# Patient Record
Sex: Female | Born: 2000 | Race: White | Hispanic: No | Marital: Single | Smoking: Never smoker
Health system: Southern US, Community
[De-identification: ages and names within clinical notes are randomized; demographics above are authoritative.]

## PROBLEM LIST (undated history)

## (undated) ENCOUNTER — Emergency Department (HOSPITAL_BASED_OUTPATIENT_CLINIC_OR_DEPARTMENT_OTHER): Discharge status: Against Medical Advice | Payer: BC Managed Care – PPO

## (undated) DIAGNOSIS — E785 Hyperlipidemia, unspecified: Secondary | ICD-10-CM

## (undated) DIAGNOSIS — I10 Essential (primary) hypertension: Secondary | ICD-10-CM

## (undated) HISTORY — DX: Hyperlipidemia, unspecified: E78.5

---
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
## 2019-02-05 ENCOUNTER — Other Ambulatory Visit: Payer: Self-pay

## 2019-02-05 DIAGNOSIS — Z20822 Contact with and (suspected) exposure to covid-19: Secondary | ICD-10-CM

## 2019-02-06 LAB — NOVEL CORONAVIRUS, NAA: SARS-CoV-2, NAA: NOT DETECTED

## 2019-04-02 ENCOUNTER — Telehealth: Payer: Self-pay | Admitting: Family Medicine

## 2019-04-02 NOTE — Telephone Encounter (Signed)
Please see previous my chart message. Patient contacted office via mychart stating she almost passed out and had difficulty breathing. I asked Dr. Pricilla Handler for advise and she advise that due to possible emergent issue she suggest patient be seen at either urgent care or ER. I sent the message back to the patient via mychart but also called her and verbally advised patient of this recommendation. Patient verbalized understanding and stated she would be going to get treatment today. AS, CMA

## 2019-07-20 ENCOUNTER — Telehealth: Payer: Self-pay | Admitting: Family Medicine

## 2019-07-20 NOTE — Telephone Encounter (Signed)
Patient a Clinical cytogeneticist message requesting appt w/ provider for certain wk days 4/23-4/30--Called her back no answer,left msg for her to call office.---NO appts available for specific dates pt requested.  --glh

## 2019-12-17 ENCOUNTER — Telehealth: Payer: Self-pay | Admitting: Physician Assistant

## 2019-12-17 DIAGNOSIS — N926 Irregular menstruation, unspecified: Secondary | ICD-10-CM

## 2019-12-17 DIAGNOSIS — Z3202 Encounter for pregnancy test, result negative: Secondary | ICD-10-CM

## 2019-12-17 NOTE — Telephone Encounter (Signed)
Patient's mother called into office needing advise.  Molly Dominguez has not had a period in 3 months.  She has taken a pregnancy test, it is negative.  Patient is sexually active and has no other symptoms.  Please Advise patient of next steps.

## 2019-12-17 NOTE — Telephone Encounter (Signed)
Spoke with Kandis Cocking who states since patient home pregnancy test are negative that the abnormal menstrual cycle could be due to weight. We have not seen patient since 2019 and do not have a recent weight.  Per patients mother patient does have a hx of abnormal menstrual cycles-going around 2 months without a period.  Spoke with patient directly since mother is not on DPR and advised that we would refer to OBGYN and they would call patient to schedule. Patient was agreeable and verbalized understanding. AS, CMA

## 2020-01-04 ENCOUNTER — Other Ambulatory Visit: Payer: Self-pay

## 2020-01-04 ENCOUNTER — Encounter: Payer: Self-pay | Admitting: Nurse Practitioner

## 2020-01-04 ENCOUNTER — Ambulatory Visit (INDEPENDENT_AMBULATORY_CARE_PROVIDER_SITE_OTHER): Payer: BC Managed Care – PPO | Admitting: Nurse Practitioner

## 2020-01-04 VITALS — BP 122/80 | Ht 61.0 in | Wt 222.0 lb

## 2020-01-04 DIAGNOSIS — N911 Secondary amenorrhea: Secondary | ICD-10-CM

## 2020-01-04 DIAGNOSIS — Z6841 Body Mass Index (BMI) 40.0 and over, adult: Secondary | ICD-10-CM | POA: Diagnosis not present

## 2020-01-04 LAB — PREGNANCY, URINE: Preg Test, Ur: NEGATIVE

## 2020-01-04 MED ORDER — MEGESTROL ACETATE 40 MG PO TABS: 40.0000 mg | ORAL_TABLET | Freq: Two times a day (BID) | ORAL | 0 refills | Status: AC

## 2020-01-04 NOTE — Progress Notes (Signed)
   Acute Office Visit  Subjective:    Patient ID: Molly Dominguez, female    DOB: [birth year], 19 y.o.   MRN: [id number]   HPI 19 y.o. presents today for amenorrhea.  LMP 09/08/2019.  Prior to this she has always had irregular cycles ranging from 1 to 2 months since menarche at age [age].  Sexually active with one partner.  Has taken 4 at home pregnancy tests, all negative.  Has had a 25 pound weight gain in the last 1 to 2 years.  Had vaginal bump recently that was at introitus on left side.  Says it was large and very painful but it did burst and had clear/bloody liquid.   Review of Systems  Constitutional: Negative.   Endocrine: Negative for cold intolerance and heat intolerance.  Genitourinary: Positive for menstrual problem. Negative for vaginal discharge and vaginal pain.       Objective:    Physical Exam Constitutional:      Appearance: Normal appearance. She is obese.  Genitourinary:    General: Normal vulva.     Vagina: Normal.     Cervix: Normal.     Uterus: Normal.      Comments: No presence of bump    BP 122/80 (BP Location: Right Arm, Patient Position: Sitting, Cuff Size: Large)   Ht 5\' 1"  (1.549 m)   Wt 222 lb (100.7 kg)   LMP 09/08/2019   BMI 41.95 kg/m  Wt Readings from Last 3 Encounters:  01/04/20 222 lb (100.7 kg)  08/07/16 201 lb 3 oz (91.3 kg) (98 %, Z= 2.04)*  02/05/14 163 lb (73.9 kg) (95 %, Z= 1.66)*   * Growth percentiles are based on CDC (Girls, 2-20 Years) data.        Assessment & Plan:   Problem List Items Addressed This Visit    None    Visit Diagnoses    Secondary amenorrhea    -  Primary   Relevant Medications   megestrol (MEGACE) 40 MG tablet   Other Relevant Orders   CBC with Differential/Platelet   Prolactin   TSH   Pregnancy, urine   BMI 40.0-44.9, adult (HCC)          Plan: TSH, prolactin, CBC today.  UPT.  Megace 40 mg twice a day for 10 days for progesterone withdrawal trial.  If bleeding does not occur within 14 days  after stopping progesterone she will let me know.  Discussed possibility of weight gain being cause for change in cycles and that a weight loss of 5 to 10% of current weight may help to regulate cycles.  She is agreeable to plan.    01-13-1982 [facility], 10:00 AM 01/04/2020

## 2020-01-25 ENCOUNTER — Encounter: Payer: Self-pay | Admitting: Nurse Practitioner

## 2020-01-25 ENCOUNTER — Other Ambulatory Visit: Payer: Self-pay

## 2020-01-25 ENCOUNTER — Ambulatory Visit (INDEPENDENT_AMBULATORY_CARE_PROVIDER_SITE_OTHER): Payer: BC Managed Care – PPO | Admitting: Nurse Practitioner

## 2020-01-25 VITALS — BP 120/82

## 2020-01-25 DIAGNOSIS — Z6841 Body Mass Index (BMI) 40.0 and over, adult: Secondary | ICD-10-CM | POA: Diagnosis not present

## 2020-01-25 DIAGNOSIS — N911 Secondary amenorrhea: Secondary | ICD-10-CM | POA: Diagnosis not present

## 2020-01-25 LAB — CBC WITH DIFFERENTIAL/PLATELET
Eosinophils Relative: 1.1 %
MCH: 28.6 pg (ref 27.0–33.0)
Monocytes Relative: 5.4 %
Total Lymphocyte: 20.2 %

## 2020-01-25 NOTE — Progress Notes (Signed)
   Acute Office Visit  Subjective:    Patient ID: Molly Dominguez, female    DOB: [birth year], 19 y.o.   MRN: [id number]   HPI 19 y.o. presents today for follow up of secondary amenorrhea. She was seen by me 01/04/2020 with complaints of no cycle for 4 months. She has always had irregular cycles that range from 1-2 months since menarche at age [age]. She reported a 25 pound weight gain over a 1-2 year span. Recommended labs at that visit but she forgot to stop and get them so she will do them today. She was prescribed Provera for withdrawal bleeding but was unable to tolerate. We discussed a weight loss of 5-10% helping to improve amenorrhea. Since that visit she has been exercising more and she started her cycle 2 days ago.    Review of Systems  Constitutional: Negative.   Gastrointestinal: Negative.   Genitourinary: Positive for menstrual problem.       Objective:    Physical Exam Constitutional:      Appearance: Normal appearance. She is obese.     BP 120/82 (BP Location: Right Arm, Patient Position: Sitting, Cuff Size: Large)   LMP 01/22/2020  Wt Readings from Last 3 Encounters:  01/04/20 222 lb (100.7 kg)  08/07/16 201 lb 3 oz (91.3 kg) (98 %, Z= 2.04)*  02/05/14 163 lb (73.9 kg) (95 %, Z= 1.66)*   * Growth percentiles are based on CDC (Girls, 2-20 Years) data.        Assessment & Plan:   Problem List Items Addressed This Visit    None    Visit Diagnoses    Secondary amenorrhea    -  Primary   BMI 40.0-44.9, adult (HCC)         Plan: CBC, TSH, prolactin today. Reassurance provided on probable weight loss helping to regulate cycles. She plans to continue working on this and continuing exercise. If labs normal and no improvement with weight loss we will schedule an ultrasound. She is agreeable to plan.      Olivia Mackie [facility], 3:35 PM 01/25/2020

## 2020-01-26 LAB — CBC WITH DIFFERENTIAL/PLATELET
Absolute Monocytes: 729 cells/uL (ref 200–950)
Basophils Absolute: 68 cells/uL (ref 0–200)
Basophils Relative: 0.5 %
Eosinophils Absolute: 149 cells/uL (ref 15–500)
HCT: 43.1 % (ref 35.0–45.0)
Hemoglobin: 14.2 g/dL (ref 11.7–15.5)
Lymphs Abs: 2727 cells/uL (ref 850–3900)
MCHC: 32.9 g/dL (ref 32.0–36.0)
MCV: 86.7 fL (ref 80.0–100.0)
MPV: 11.3 fL (ref 7.5–12.5)
Neutro Abs: 9828 cells/uL — ABNORMAL HIGH (ref 1500–7800)
Neutrophils Relative %: 72.8 %
Platelets: 369 10*3/uL (ref 140–400)
RBC: 4.97 10*6/uL (ref 3.80–5.10)
RDW: 12.3 % (ref 11.0–15.0)
WBC: 13.5 10*3/uL — ABNORMAL HIGH (ref 3.8–10.8)

## 2020-01-26 LAB — PROLACTIN: Prolactin: 18.2 ng/mL

## 2020-01-26 LAB — TSH: TSH: 1.43 mIU/L

## 2020-10-04 ENCOUNTER — Ambulatory Visit
Admission: EM | Admit: 2020-10-04 | Discharge: 2020-10-04 | Disposition: A | Discharge status: Home / Self Care | Payer: Self-pay | Attending: Emergency Medicine | Admitting: Emergency Medicine

## 2020-10-04 ENCOUNTER — Other Ambulatory Visit: Payer: Self-pay

## 2020-10-04 DIAGNOSIS — R42 Dizziness and giddiness: Secondary | ICD-10-CM

## 2020-10-04 DIAGNOSIS — R112 Nausea with vomiting, unspecified: Secondary | ICD-10-CM

## 2020-10-04 LAB — POCT URINALYSIS DIP (MANUAL ENTRY)
Bilirubin, UA: NEGATIVE
Blood, UA: NEGATIVE
Glucose, UA: NEGATIVE mg/dL
Ketones, POC UA: NEGATIVE mg/dL
Leukocytes, UA: NEGATIVE
Nitrite, UA: NEGATIVE
Protein Ur, POC: 30 mg/dL — AB
Spec Grav, UA: 1.02 (ref 1.010–1.025)
Urobilinogen, UA: 0.2 E.U./dL
pH, UA: 8 (ref 5.0–8.0)

## 2020-10-04 LAB — POCT FASTING CBG KUC MANUAL ENTRY: POCT Glucose (KUC): 90 mg/dL (ref 70–99)

## 2020-10-04 MED ORDER — MECLIZINE HCL 12.5 MG PO TABS: 25.0000 mg | ORAL_TABLET | Freq: Three times a day (TID) | ORAL | 0 refills | Status: DC | PRN

## 2020-10-04 MED ORDER — HYDROXYZINE HCL 25 MG PO TABS: 25.0000 mg | ORAL_TABLET | Freq: Four times a day (QID) | ORAL | 0 refills | Status: DC | PRN

## 2020-10-04 MED ORDER — ONDANSETRON 4 MG PO TBDP: 4.0000 mg | ORAL_TABLET | Freq: Three times a day (TID) | ORAL | 0 refills | Status: DC | PRN

## 2020-10-04 NOTE — ED Triage Notes (Signed)
Patient presents to Urgent Care with complaints of chest pain, SOB, blurred vision, dizziness, shakiness, nausea, and 3 episodes of vomiting since yesterday. Pt reports she has been exp heart palpitations for about 1-2 months now. Pt states she has no hx of any medical issues last wellness check 2 years ago. She states last BP check today was 142/105.   Denies fever or abdominal pain.

## 2020-10-04 NOTE — ED Provider Notes (Signed)
[name]    CSN: [id number] Arrival date & time: 10/04/20  1556      History   Chief Complaint Chief Complaint  Patient presents with   Shortness of Breath   Chest Pain    HPI Any Mcneice is a 20 y.o. female presenting today for evaluation of chest pain and shortness of breath.  Reports over the past 1 to 2 days has developed chest pain, shortness of breath with associated blurred vision, dizziness, nausea and vomiting.  Reports last night she had had 2 alcoholic drinks, later in the evening around 1030 she smoked marijuana, around 1130 when she went home she felt normal, laid down and then felt a sensation of chest tightness, heart racing/beating hard, dizziness described as spinning as well as associated spots in her vision.  Reports feeling shaky and having difficulty standing.  This led to 3 episodes of nonbloody emesis.  After she continued to feel nauseous and dizzy for couple of hours but ended up falling asleep.  Woke up today and is continued to have some discomfort in her chest mainly centrally as well as some slight vision and dizziness with nausea, but the symptoms are improved/more mild from what she was experiencing last night.  She does report that she has had episodes of palpitations over the past 1 to 2 months and will typically feel a couple times per week.  Reports occasional vaping, denies cigarette use.  Reports she has had occasional marijuana use for many years without similar symptoms.  Reports that dizziness is worse with standing, improved with standing.  Was able to tolerate some oral intake today including chicken and 2 and half bottles of water.  Continues to feel a slight fogginess.  Denies associated headache last night or today.  Denies any numbness tingling or weakness.  Denies leg pain or leg swelling.  Denies history of DVT/PE.  Denies recent travel immobilization or surgery.  Denies any birth control use.  Last menstrual cycle approximately 1  month ago. Denies abdominal pain.    HPI  History reviewed. No pertinent past medical history.  Patient Active Problem List   Diagnosis Date Noted   Healthcare maintenance 01/21/2017   Obesity (BMI 35.0-39.9 without comorbidity) 01/21/2017    History reviewed. No pertinent surgical history.  OB History     Gravida  0   Para  0   Term  0   Preterm  0   AB  0   Living  0      SAB  0   IAB  0   Ectopic  0   Multiple  0   Live Births  0            Home Medications    Prior to Admission medications   Medication Sig Start Date End Date Taking? Authorizing Provider  hydrOXYzine (ATARAX/VISTARIL) 25 MG tablet Take 1 tablet (25 mg total) by mouth every 6 (six) hours as needed for anxiety. 10/04/20  Yes Coleta Grosshans C, PA-C  meclizine (ANTIVERT) 12.5 MG tablet Take 2 tablets (25 mg total) by mouth 3 (three) times daily as needed for dizziness. 10/04/20  Yes Camiyah Friberg C, PA-C  ondansetron (ZOFRAN ODT) 4 MG disintegrating tablet Take 1 tablet (4 mg total) by mouth every 8 (eight) hours as needed for nausea or vomiting. 10/04/20  Yes Quynh Basso, [place] C, PA-C    Family History Family History  Problem Relation Age of Onset   Hyperlipidemia Mother    Hypertension  Mother    Hypertension Father    Hyperlipidemia Father     Social History Social History   Tobacco Use   Smoking status: Never   Smokeless tobacco: Never  Substance Use Topics   Alcohol use: No   Drug use: Yes    Types: Marijuana     Allergies   Patient has no known allergies.   Review of Systems Review of Systems  Constitutional:  Negative for fatigue and fever.  HENT:  Negative for congestion, sinus pressure and sore throat.   Eyes:  Negative for photophobia, pain and visual disturbance.  Respiratory:  Negative for cough and shortness of breath.   Cardiovascular:  Positive for chest pain.  Gastrointestinal:  Positive for nausea and vomiting. Negative for abdominal pain.   Genitourinary:  Negative for decreased urine volume and hematuria.  Musculoskeletal:  Negative for myalgias, neck pain and neck stiffness.  Neurological:  Positive for dizziness and headaches. Negative for syncope, facial asymmetry, speech difficulty, weakness, light-headedness and numbness.    Physical Exam Triage Vital Signs ED Triage Vitals  Enc Vitals Group     BP 10/04/20 1609 (!) 150/91     Pulse Rate 10/04/20 1609 (!) 112     Resp 10/04/20 1609 18     Temp 10/04/20 1609 98.8 F (37.1 C)     Temp Source 10/04/20 1609 Oral     SpO2 10/04/20 1609 98 %     Weight --      Height --      Head Circumference --      Peak Flow --      Pain Score 10/04/20 1606 0     Pain Loc --      Pain Edu? --      Excl. in GC? --    No data found.  Updated Vital Signs BP 137/81 (BP Location: Left Arm)   Pulse 96   Temp 98.8 F (37.1 C) (Oral)   Resp 18   LMP  (Within Months) Comment: 1 month ago  SpO2 97%   Visual Acuity Right Eye Distance:   Left Eye Distance:   Bilateral Distance:    Right Eye Near:   Left Eye Near:    Bilateral Near:     Physical Exam Vitals and nursing note reviewed.  Constitutional:      Appearance: She is well-developed.     Comments: No acute distress  HENT:     Head: Normocephalic and atraumatic.     Ears:     Comments: Bilateral ears without tenderness to palpation of external auricle, tragus and mastoid, EAC's without erythema or swelling, TM's with good bony landmarks and cone of light. Non erythematous.      Nose: Nose normal.     Mouth/Throat:     Comments: Oral mucosa pink and moist, no tonsillar enlargement or exudate. Posterior pharynx patent and nonerythematous, no uvula deviation or swelling. Normal phonation.  Eyes:     Extraocular Movements: Extraocular movements intact.     Conjunctiva/sclera: Conjunctivae normal.     Pupils: Pupils are equal, round, and reactive to light.  Cardiovascular:     Rate and Rhythm: Normal rate and  regular rhythm.  Pulmonary:     Effort: Pulmonary effort is normal. No respiratory distress.     Comments: Breathing comfortably at rest, CTABL, no wheezing, rales or other adventitious sounds auscultated  Abdominal:     General: There is no distension.     Comments: Nontender to palpation  Musculoskeletal:        General: Normal range of motion.     Cervical back: Neck supple.     Comments: Bilateral lower legs symmetric without calf swelling or tenderness  Skin:    General: Skin is warm and dry.  Neurological:     General: No focal deficit present.     Mental Status: She is alert and oriented to person, place, and time. Mental status is at baseline.     Cranial Nerves: No cranial nerve deficit.     Motor: No weakness.     UC Treatments / Results  Labs (all labs ordered are listed, but only abnormal results are displayed) Labs Reviewed  POCT URINALYSIS DIP (MANUAL ENTRY) - Abnormal; Notable for the following components:      Result Value   Clarity, UA cloudy (*)    Protein Ur, POC =30 (*)    All other components within normal limits  BASIC METABOLIC PANEL  CBC WITH DIFFERENTIAL/PLATELET  TSH  POCT FASTING CBG [facility] MANUAL ENTRY    EKG   Radiology No results found.  Procedures Procedures (including critical care time)  Medications Ordered in UC Medications - No data to display  Initial Impression / Assessment and Plan / UC Course  I have reviewed the triage vital signs and the nursing notes.  Pertinent labs & imaging results that were available during my care of the patient were reviewed by me and considered in my medical decision making (see chart for details).  Clinical Course as of 10/04/20 1733  Tue Oct 04, 2020  1624 137/81; HR 96, O2 97% [HW]    Clinical Course User Index [HW] Whittany Parish C, PA-C    Dizziness/nausea/chest tightness-EKG normal sinus rhythm, no acute signs of ischemia or infarction, no abnormal beats, no chest x-ray available in  house today, will provide outpatient follow-up chest x-ray to further evaluate for shortness of breath/chest tightness, checking basic labs of CBC and BMP along with TSH.  Suspect possible correlation to alcohol/marijuana use or underlying anxiety, symptoms are improved today and without any current neurodeficits.  Low suspicion of ACS.  Opting to treat symptomatically and supportively with close monitoring.  Encouraged follow-up in ED if symptoms worsening again, outpatient follow-up with cardiology for further evaluation of palpitations.  Discussed strict return precautions. Patient verbalized understanding and is agreeable with plan.     Final Clinical Impressions(s) / UC Diagnoses   Final diagnoses:  Dizziness  Non-intractable vomiting with nausea, unspecified vomiting type     Discharge Instructions      Please go to emergency room if symptoms are worsening  Blood work pending; May go to [facility] for chest xray Please use Zofran dissolved in mouth as needed for nausea/vomiting May try meclizine as needed for dizziness/nausea, may cause drowsiness Hydroxyzine as needed for anxiety, may cause drowsiness, limit use with meclizine Rest and drink plenty of fluids Follow-up with cardiology for further evaluation of palpitations Please establish care with primary care      ED Prescriptions     Medication Sig Dispense Auth. Provider   ondansetron (ZOFRAN ODT) 4 MG disintegrating tablet Take 1 tablet (4 mg total) by mouth every 8 (eight) hours as needed for nausea or vomiting. 20 tablet Jayleon Mcfarlane C, PA-C   meclizine (ANTIVERT) 12.5 MG tablet Take 2 tablets (25 mg total) by mouth 3 (three) times daily as needed for dizziness. 30 tablet Merridy Pascoe C, PA-C   hydrOXYzine (ATARAX/VISTARIL) 25 MG tablet Take 1  tablet (25 mg total) by mouth every 6 (six) hours as needed for anxiety. 16 tablet Charleen Madera, [place] C, PA-C      PDMP not reviewed this encounter.   Lew Dawes, [place] 10/04/20 1733

## 2020-10-04 NOTE — Discharge Instructions (Addendum)
Please go to emergency room if symptoms are worsening  Blood work pending; May go to [facility] for chest xray Please use Zofran dissolved in mouth as needed for nausea/vomiting May try meclizine as needed for dizziness/nausea, may cause drowsiness Hydroxyzine as needed for anxiety, may cause drowsiness, limit use with meclizine Rest and drink plenty of fluids Follow-up with cardiology for further evaluation of palpitations Please establish care with primary care

## 2020-10-04 NOTE — ED Notes (Signed)
BG=90

## 2020-10-06 ENCOUNTER — Encounter: Payer: Self-pay | Admitting: Physician Assistant

## 2020-10-06 LAB — CBC WITH DIFFERENTIAL/PLATELET
Basophils Absolute: 0.1 10*3/uL (ref 0.0–0.2)
Basos: 0 %
EOS (ABSOLUTE): 0.2 10*3/uL (ref 0.0–0.4)
Eos: 1 %
Hematocrit: 42.9 % (ref 34.0–46.6)
Hemoglobin: 13.2 g/dL (ref 11.1–15.9)
Immature Grans (Abs): 0 10*3/uL (ref 0.0–0.1)
Immature Granulocytes: 0 %
Lymphocytes Absolute: 2.7 10*3/uL (ref 0.7–3.1)
Lymphs: 20 %
MCH: 28.3 pg (ref 26.6–33.0)
MCHC: 30.8 g/dL — ABNORMAL LOW (ref 31.5–35.7)
MCV: 92 fL (ref 79–97)
Monocytes Absolute: 0.7 10*3/uL (ref 0.1–0.9)
Monocytes: 5 %
Neutrophils Absolute: 9.4 10*3/uL — ABNORMAL HIGH (ref 1.4–7.0)
Neutrophils: 74 %
Platelets: 343 10*3/uL (ref 150–450)
RBC: 4.67 x10E6/uL (ref 3.77–5.28)
RDW: 12.9 % (ref 11.7–15.4)
WBC: 13 10*3/uL — ABNORMAL HIGH (ref 3.4–10.8)

## 2020-10-06 LAB — BASIC METABOLIC PANEL
BUN/Creatinine Ratio: 18 (ref 9–23)
BUN: 12 mg/dL (ref 6–20)
CO2: 20 mmol/L (ref 20–29)
Calcium: 9.1 mg/dL (ref 8.7–10.2)
Chloride: 104 mmol/L (ref 96–106)
Creatinine, Ser: 0.68 mg/dL (ref 0.57–1.00)
Glucose: 103 mg/dL — ABNORMAL HIGH (ref 65–99)
Potassium: 4.3 mmol/L (ref 3.5–5.2)
Sodium: 138 mmol/L (ref 134–144)
eGFR: 127 mL/min/{1.73_m2} (ref >59)

## 2020-10-06 LAB — TSH: TSH: 1.43 u[IU]/mL (ref 0.450–4.500)

## 2020-10-07 NOTE — Telephone Encounter (Signed)
Spoke with patient about lab results. She will reach out to the Urgent Care to ask about her labs and she will call the front desk to schedule a re-establish care appointment.

## 2021-03-14 ENCOUNTER — Other Ambulatory Visit: Payer: Self-pay

## 2021-03-14 ENCOUNTER — Ambulatory Visit
Admission: EM | Admit: 2021-03-14 | Discharge: 2021-03-14 | Disposition: A | Discharge status: Home / Self Care | Payer: BC Managed Care – PPO | Attending: Internal Medicine | Admitting: Internal Medicine

## 2021-03-14 DIAGNOSIS — J029 Acute pharyngitis, unspecified: Secondary | ICD-10-CM | POA: Diagnosis not present

## 2021-03-14 DIAGNOSIS — J039 Acute tonsillitis, unspecified: Secondary | ICD-10-CM | POA: Diagnosis not present

## 2021-03-14 LAB — POCT RAPID STREP A (OFFICE): Rapid Strep A Screen: NEGATIVE

## 2021-03-14 MED ORDER — AMOXICILLIN 875 MG PO TABS: 875.0000 mg | ORAL_TABLET | Freq: Two times a day (BID) | ORAL | 0 refills | Status: AC

## 2021-03-14 NOTE — Discharge Instructions (Signed)
Your rapid strep was negative.  Throat culture, COVID-19, flu test pending.  You were prescribed an antibiotic for acute tonsillitis.

## 2021-03-14 NOTE — ED Provider Notes (Signed)
[name]    CSN: [id number] Arrival date & time: 03/14/21  0912      History   Chief Complaint Chief Complaint  Patient presents with   Sore Throat    HPI Molly Dominguez is a 20 y.o. female.   Patient presents with sore throat, body aches, chills that started approximately 2 days ago.  Denies cough, nasal congestion, runny nose, chest pain, shortness of breath, nausea, vomiting, diarrhea, abdominal pain.  Denies any known sick contacts or fevers.  Patient has not yet taken any medications to help alleviate symptoms.   Sore Throat   History reviewed. No pertinent past medical history.  Patient Active Problem List   Diagnosis Date Noted   Healthcare maintenance 01/21/2017   Obesity (BMI 35.0-39.9 without comorbidity) 01/21/2017    History reviewed. No pertinent surgical history.  OB History     Gravida  0   Para  0   Term  0   Preterm  0   AB  0   Living  0      SAB  0   IAB  0   Ectopic  0   Multiple  0   Live Births  0            Home Medications    Prior to Admission medications   Medication Sig Start Date End Date Taking? Authorizing Provider  amoxicillin (AMOXIL) 875 MG tablet Take 1 tablet (875 mg total) by mouth 2 (two) times daily for 10 days. 03/14/21 03/24/21 Yes Gustavus Bryant, FNP    Family History Family History  Problem Relation Age of Onset   Hyperlipidemia Mother    Hypertension Mother    Hypertension Father    Hyperlipidemia Father     Social History Social History   Tobacco Use   Smoking status: Never   Smokeless tobacco: Never  Substance Use Topics   Alcohol use: No   Drug use: Not Currently    Types: Marijuana     Allergies   Patient has no known allergies.   Review of Systems Review of Systems Per HPI  Physical Exam Triage Vital Signs ED Triage Vitals  Enc Vitals Group     BP 03/14/21 0957 (!) 154/91     Pulse Rate 03/14/21 0957 86     Resp 03/14/21 0957 18     Temp  03/14/21 0957 98 F (36.7 C)     Temp Source 03/14/21 0957 Oral     SpO2 03/14/21 0957 98 %     Weight --      Height --      Head Circumference --      Peak Flow --      Pain Score 03/14/21 0958 5     Pain Loc --      Pain Edu? --      Excl. in GC? --    No data found.  Updated Vital Signs BP (!) 154/91 (BP Location: Left Arm)    Pulse 86    Temp 98 F (36.7 C) (Oral)    Resp 18    LMP 02/21/2021    SpO2 98%   Visual Acuity Right Eye Distance:   Left Eye Distance:   Bilateral Distance:    Right Eye Near:   Left Eye Near:    Bilateral Near:     Physical Exam Constitutional:      General: She is not in acute distress.    Appearance: Normal appearance. She is  not toxic-appearing or diaphoretic.  HENT:     Head: Normocephalic and atraumatic.     Right Ear: Tympanic membrane and ear canal normal.     Left Ear: Tympanic membrane and ear canal normal.     Nose: Nose normal.     Mouth/Throat:     Lips: Pink.     Mouth: Mucous membranes are moist.     Pharynx: Oropharyngeal exudate and posterior oropharyngeal erythema present. No pharyngeal swelling or uvula swelling.     Tonsils: No tonsillar exudate or tonsillar abscesses. 2+ on the right. 2+ on the left.  Eyes:     Extraocular Movements: Extraocular movements intact.     Conjunctiva/sclera: Conjunctivae normal.     Pupils: Pupils are equal, round, and reactive to light.  Cardiovascular:     Rate and Rhythm: Normal rate and regular rhythm.     Pulses: Normal pulses.     Heart sounds: Normal heart sounds.  Pulmonary:     Effort: Pulmonary effort is normal. No respiratory distress.     Breath sounds: Normal breath sounds. No stridor. No wheezing, rhonchi or rales.  Abdominal:     General: Abdomen is flat. Bowel sounds are normal.     Palpations: Abdomen is soft.  Musculoskeletal:        General: Normal range of motion.     Cervical back: Normal range of motion.  Skin:    General: Skin is warm and dry.   Neurological:     General: No focal deficit present.     Mental Status: She is alert and oriented to person, place, and time. Mental status is at baseline.  Psychiatric:        Mood and Affect: Mood normal.        Behavior: Behavior normal.     UC Treatments / Results  Labs (all labs ordered are listed, but only abnormal results are displayed) Labs Reviewed  CULTURE, GROUP A STREP ([facility])  COVID-19, FLU A+B NAA  POCT RAPID STREP A (OFFICE)    EKG   Radiology No results found.  Procedures Procedures (including critical care time)  Medications Ordered in UC Medications - No data to display  Initial Impression / Assessment and Plan / UC Course  I have reviewed the triage vital signs and the nursing notes.  Pertinent labs & imaging results that were available during my care of the patient were reviewed by me and considered in my medical decision making (see chart for details).     Rapid strep was negative but still suspicious of strep throat given appearance of posterior pharynx on exam.  Will treat with amoxicillin x10 days.  Throat culture is pending.  COVID-19 and flu test also pending to rule out viral etiology.  No signs of peritonsillar abscess on exam.  Discussed strict return precautions.  Patient verbalized understanding and was agreeable with plan. Final Clinical Impressions(s) / UC Diagnoses   Final diagnoses:  Acute tonsillitis, unspecified etiology  Sore throat     Discharge Instructions      Your rapid strep was negative.  Throat culture, COVID-19, flu test pending.  You were prescribed an antibiotic for acute tonsillitis.    ED Prescriptions     Medication Sig Dispense Auth. Provider   amoxicillin (AMOXIL) 875 MG tablet Take 1 tablet (875 mg total) by mouth 2 (two) times daily for 10 days. 20 tablet V[place], Acie Fredrickson, [place]      PDMP not reviewed this encounter.   [place], [place]  E, FNP 03/14/21 1021

## 2021-03-14 NOTE — ED Triage Notes (Signed)
Pt c/o sore throat, body aches, and chills x2 days. States hx of strep and feels the same.

## 2021-03-15 LAB — COVID-19, FLU A+B NAA
Influenza A, NAA: NOT DETECTED
Influenza B, NAA: NOT DETECTED
SARS-CoV-2, NAA: NOT DETECTED

## 2021-03-15 LAB — CULTURE, GROUP A STREP (THRC)

## 2021-05-10 ENCOUNTER — Ambulatory Visit
Admission: EM | Admit: 2021-05-10 | Discharge: 2021-05-10 | Disposition: A | Discharge status: Home / Self Care | Payer: BC Managed Care – PPO | Attending: Urgent Care | Admitting: Urgent Care

## 2021-05-10 ENCOUNTER — Other Ambulatory Visit: Payer: Self-pay

## 2021-05-10 ENCOUNTER — Ambulatory Visit (INDEPENDENT_AMBULATORY_CARE_PROVIDER_SITE_OTHER): Payer: BC Managed Care – PPO

## 2021-05-10 DIAGNOSIS — R0789 Other chest pain: Secondary | ICD-10-CM | POA: Diagnosis not present

## 2021-05-10 LAB — POCT URINE PREGNANCY: Preg Test, Ur: NEGATIVE

## 2021-05-10 MED ORDER — NAPROXEN 500 MG PO TABS: 500.0000 mg | ORAL_TABLET | Freq: Two times a day (BID) | ORAL | 0 refills | Status: DC

## 2021-05-10 MED ORDER — TIZANIDINE HCL 4 MG PO TABS: 4.0000 mg | ORAL_TABLET | Freq: Every day | ORAL | 0 refills | Status: DC

## 2021-05-10 NOTE — ED Triage Notes (Signed)
Pt states working out on a chest machine at the gym this morning and felt a pop. C/o center chest/sternum pain and pain on inspiration.

## 2021-05-10 NOTE — ED Provider Notes (Signed)
[facility]   MRN: [id number] DOB: January 25, 2001  Subjective:   Molly Dominguez is a 21 y.o. female presenting for acute onset this morning of midsternal chest pain.  Patient was using a weightlifting machine and as she did she heard a loud pop and has since had persistent and moderate to severe pain over the center of her chest and sternum.  The pain has persisted and is more constant, worsens with taking a deep breath.  No swelling, bruising, shortness of breath, bony deformity. She is sexually active but does not use condoms for protection. Has irregular cycles.   No current facility-administered medications for this encounter. No current outpatient medications on file.   No Known Allergies  History reviewed. No pertinent past medical history.   History reviewed. No pertinent surgical history.  Family History  Problem Relation Age of Onset   Hyperlipidemia Mother    Hypertension Mother    Hypertension Father    Hyperlipidemia Father     Social History   Tobacco Use   Smoking status: Never   Smokeless tobacco: Never  Substance Use Topics   Alcohol use: No   Drug use: Not Currently    Types: Marijuana    ROS   Objective:   Vitals: BP 111/71 (BP Location: Left Arm)    Pulse 79    Temp 98 F (36.7 C) (Oral)    Resp 18    LMP 04/03/2021    SpO2 97%   Physical Exam Constitutional:      General: She is not in acute distress.    Appearance: Normal appearance. She is well-developed. She is not ill-appearing, toxic-appearing or diaphoretic.  HENT:     Head: Normocephalic and atraumatic.     Nose: Nose normal.     Mouth/Throat:     Mouth: Mucous membranes are moist.  Eyes:     General: No scleral icterus.       Right eye: No discharge.        Left eye: No discharge.     Extraocular Movements: Extraocular movements intact.  Cardiovascular:     Rate and Rhythm: Normal rate.     Heart sounds: No murmur heard.   No friction rub. No gallop.  Pulmonary:      Effort: Pulmonary effort is normal. No respiratory distress.     Breath sounds: No stridor. No wheezing, rhonchi or rales.  Chest:     Chest wall: Tenderness (mid sternal) present.  Skin:    General: Skin is warm and dry.  Neurological:     General: No focal deficit present.     Mental Status: She is alert and oriented to person, place, and time.  Psychiatric:        Mood and Affect: Mood normal.        Behavior: Behavior normal.    DG Chest 2 View  Result Date: 05/10/2021 CLINICAL DATA:  mid sternal chest pain EXAM: CHEST - 2 VIEW COMPARISON:  None. FINDINGS: No consolidation. No visible pleural effusions or pneumothorax. Cardiomediastinal silhouette is within normal limits. No evidence of acute osseous abnormality with limited visualization of the sternum. IMPRESSION: 1. No evidence of acute cardiopulmonary disease. 2. Limited visualization of the sternum. Dedicated sternum radiographs or chest CT could provide more sensitive evaluation if clinically indicated. Electronically Signed   By: Feliberto Harts M.D.   On: 05/10/2021 13:30     Assessment and Plan :   PDMP not reviewed this encounter.  1. Chest wall pain  2. Sternum pain    After a discussion with our radiology tech, a sternum x-ray was not feasible at our clinic.  Patient is not appropriate for an ER visit for chest CT scan.  I discussed this with her and she was agreeable to a trial of naproxen, tizanidine and resting, modification of her physical activity. Counseled patient on potential for adverse effects with medications prescribed/recommended today, ER and return-to-clinic precautions discussed, patient verbalized understanding.    Wallis Bamberg, PA-C 05/10/21 1344

## 2022-04-24 ENCOUNTER — Ambulatory Visit
Admission: EM | Admit: 2022-04-24 | Discharge: 2022-04-24 | Disposition: A | Discharge status: Home / Self Care | Payer: BC Managed Care – PPO | Attending: Internal Medicine | Admitting: Internal Medicine

## 2022-04-24 DIAGNOSIS — J029 Acute pharyngitis, unspecified: Secondary | ICD-10-CM | POA: Insufficient documentation

## 2022-04-24 DIAGNOSIS — Z20822 Contact with and (suspected) exposure to covid-19: Secondary | ICD-10-CM | POA: Insufficient documentation

## 2022-04-24 LAB — POCT MONO SCREEN (KUC): Mono, POC: NEGATIVE

## 2022-04-24 LAB — POCT RAPID STREP A (OFFICE): Rapid Strep A Screen: NEGATIVE

## 2022-04-24 LAB — SARS CORONAVIRUS 2 (TAT 6-24 HRS): SARS Coronavirus 2: NEGATIVE

## 2022-04-24 NOTE — ED Provider Notes (Signed)
[name]    CSN: [id number] Arrival date & time: 04/24/22  0813      History   Chief Complaint Chief Complaint  Patient presents with   Sore Throat    HPI Molly Dominguez is a 22 y.o. female.   Patient presents with sore throat that started yesterday.  Patient reports mild runny nose as well.  Denies cough, chest pain, shortness of breath, ear pain, nausea, vomiting, diarrhea, abdominal pain.  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  Denies history of asthma.  Has not had any medications to help alleviate symptoms.   Sore Throat    History reviewed. No pertinent past medical history.  Patient Active Problem List   Diagnosis Date Noted   Healthcare maintenance 01/21/2017   Obesity (BMI 35.0-39.9 without comorbidity) 01/21/2017    History reviewed. No pertinent surgical history.  OB History     Gravida  0   Para  0   Term  0   Preterm  0   AB  0   Living  0      SAB  0   IAB  0   Ectopic  0   Multiple  0   Live Births  0            Home Medications    Prior to Admission medications   Medication Sig Start Date End Date Taking? Authorizing Provider  naproxen (NAPROSYN) 500 MG tablet Take 1 tablet (500 mg total) by mouth 2 (two) times daily with a meal. 05/10/21   Jaynee Eagles, PA-C  tiZANidine (ZANAFLEX) 4 MG tablet Take 1 tablet (4 mg total) by mouth at bedtime. 05/10/21   Jaynee Eagles, PA-C    Family History Family History  Problem Relation Age of Onset   Hyperlipidemia Mother    Hypertension Mother    Hypertension Father    Hyperlipidemia Father     Social History Social History   Tobacco Use   Smoking status: Never   Smokeless tobacco: Never  Substance Use Topics   Alcohol use: No   Drug use: Not Currently    Types: Marijuana     Allergies   Patient has no known allergies.   Review of Systems Review of Systems Per HPI  Physical  Exam Triage Vital Signs ED Triage Vitals  Enc Vitals Group     BP 04/24/22 0959 (!) 148/98     Pulse Rate 04/24/22 0959 95     Resp 04/24/22 0959 19     Temp 04/24/22 0959 97.9 F (36.6 C)     Temp src --      SpO2 04/24/22 0959 98 %     Weight --      Height --      Head Circumference --      Peak Flow --      Pain Score 04/24/22 0958 5     Pain Loc --      Pain Edu? --      Excl. in [place]? --    No data found.  Updated Vital Signs BP (!) 148/98   Pulse 95   Temp 97.9 F (36.6 C)   Resp 19   LMP  (Approximate)   SpO2 98%   Visual Acuity Right Eye Distance:   Left Eye Distance:   Bilateral Distance:    Right Eye Near:   Left Eye Near:    Bilateral  Near:     Physical Exam Constitutional:      General: She is not in acute distress.    Appearance: Normal appearance. She is not toxic-appearing or diaphoretic.  HENT:     Head: Normocephalic and atraumatic.     Right Ear: Tympanic membrane and ear canal normal.     Left Ear: Tympanic membrane and ear canal normal.     Nose: Congestion present.     Mouth/Throat:     Mouth: Mucous membranes are moist.     Pharynx: Posterior oropharyngeal erythema present.  Eyes:     Extraocular Movements: Extraocular movements intact.     Conjunctiva/sclera: Conjunctivae normal.     Pupils: Pupils are equal, round, and reactive to light.  Cardiovascular:     Rate and Rhythm: Normal rate and regular rhythm.     Pulses: Normal pulses.     Heart sounds: Normal heart sounds.  Pulmonary:     Effort: Pulmonary effort is normal. No respiratory distress.     Breath sounds: Normal breath sounds. No stridor. No wheezing, rhonchi or rales.  Abdominal:     General: Abdomen is flat. Bowel sounds are normal.     Palpations: Abdomen is soft.  Musculoskeletal:        General: Normal range of motion.     Cervical back: Normal range of motion.  Skin:    General: Skin is warm and dry.  Neurological:     General: No focal deficit present.      Mental Status: She is alert and oriented to person, place, and time. Mental status is at baseline.  Psychiatric:        Mood and Affect: Mood normal.        Behavior: Behavior normal.      UC Treatments / Results  Labs (all labs ordered are listed, but only abnormal results are displayed) Labs Reviewed  CULTURE, GROUP A STREP ([place])  SARS CORONAVIRUS 2 (TAT 6-24 HRS)  POCT RAPID STREP A (OFFICE)  POCT MONO SCREEN ([facility])    EKG   Radiology No results found.  Procedures Procedures (including critical care time)  Medications Ordered in UC Medications - No data to display  Initial Impression / Assessment and Plan / UC Course  I have reviewed the triage vital signs and the nursing notes.  Pertinent labs & imaging results that were available during my care of the patient were reviewed by me and considered in my medical decision making (see chart for details).     Suspect viral cause to symptoms.  Rapid strep and rapid mono were negative.  Throat culture and COVID test are pending.  Discussed supportive care and symptom management with patient.  Discussed return precautions.  Patient verbalized understanding and was agreeable with plan. Final Clinical Impressions(s) / UC Diagnoses   Final diagnoses:  Viral pharyngitis  Encounter for laboratory testing for COVID-19 virus     Discharge Instructions      Rapid strep and rapid monotest were negative.  Throat culture and COVID test are pending.  As we discussed, it appears that you have a viral illness that should run its course.  Please follow-up if any symptoms persist or worsen.     ED Prescriptions   None    PDMP not reviewed this encounter.   Teodora Medici, August 04/24/22 1059

## 2022-04-24 NOTE — Discharge Instructions (Signed)
Rapid strep and rapid monotest were negative.  Throat culture and COVID test are pending.  As we discussed, it appears that you have a viral illness that should run its course.  Please follow-up if any symptoms persist or worsen.

## 2022-04-24 NOTE — ED Triage Notes (Signed)
Pt presents to uc with co of sore throat and pain during swallowing since Sunday. Pt reports no otc medication

## 2022-04-26 LAB — CULTURE, GROUP A STREP (THRC)

## 2022-06-02 ENCOUNTER — Ambulatory Visit
Admission: EM | Admit: 2022-06-02 | Discharge: 2022-06-02 | Disposition: A | Discharge status: Home / Self Care | Payer: BC Managed Care – PPO | Attending: Emergency Medicine | Admitting: Emergency Medicine

## 2022-06-02 ENCOUNTER — Ambulatory Visit (INDEPENDENT_AMBULATORY_CARE_PROVIDER_SITE_OTHER): Payer: BC Managed Care – PPO

## 2022-06-02 DIAGNOSIS — R059 Cough, unspecified: Secondary | ICD-10-CM | POA: Diagnosis not present

## 2022-06-02 DIAGNOSIS — J069 Acute upper respiratory infection, unspecified: Secondary | ICD-10-CM

## 2022-06-02 DIAGNOSIS — R0989 Other specified symptoms and signs involving the circulatory and respiratory systems: Secondary | ICD-10-CM

## 2022-06-02 MED ORDER — AMOXICILLIN-POT CLAVULANATE 875-125 MG PO TABS: 1.0000 | ORAL_TABLET | Freq: Two times a day (BID) | ORAL | 0 refills | Status: AC

## 2022-06-02 NOTE — Discharge Instructions (Addendum)
Chest x-ray is negative   symptoms have been present for at least 2 months we will provide you an antibiotic as a bacteria is most likely aiding to your symptoms being prolonged  Begin Augmentin every morning and every evening for 10 days, daily will take 48 hours for your medication to take effect and then you will see steady improvement from there    You can take Tylenol and/or Ibuprofen as needed for fever reduction and pain relief.   For cough: honey 1/2 to 1 teaspoon (you can dilute the honey in water or another fluid).  You can also use guaifenesin and dextromethorphan for cough. You can use a humidifier for chest congestion and cough.  If you don't have a humidifier, you can sit in the bathroom with the hot shower running.      For sore throat: try warm salt water gargles, cepacol lozenges, throat spray, warm tea or water with lemon/honey, popsicles or ice, or OTC cold relief medicine for throat discomfort.   For congestion: take a daily anti-histamine like Zyrtec, Claritin, and a oral decongestant, such as pseudoephedrine.  You can also use Flonase 1-2 sprays in each nostril daily.   It is important to stay hydrated: drink plenty of fluids (water, gatorade/powerade/pedialyte, juices, or teas) to keep your throat moisturized and help further relieve irritation/discomfort.

## 2022-06-02 NOTE — ED Provider Notes (Signed)
[name]    CSN: [id number] Arrival date & time: 06/02/22  0934      History   Chief Complaint No chief complaint on file.   HPI Molly Dominguez is a 22 y.o. female.   Presents for evaluation of nasal congestion, rhinorrhea, nonproductive cough present for 1 month.  Over the last week symptoms have worsened sputum has become green, she is experiencing shortness of breath with exertion and wheezing as well as centralized chest pain.  At times is heard a crackling to the throat.  Began to experience left eye erythema, pruritus this morning upon awakening.  Denies drainage or visual changes.  Denies contact usage.  Possible sick contacts prior to symptoms beginning.  Tolerating food and liquids.  Has been attempting use of over-the-counter medications which have been ineffective.  History reviewed. No pertinent past medical history.  Patient Active Problem List   Diagnosis Date Noted   Healthcare maintenance 01/21/2017   Obesity (BMI 35.0-39.9 without comorbidity) 01/21/2017    History reviewed. No pertinent surgical history.  OB History     Gravida  0   Para  0   Term  0   Preterm  0   AB  0   Living  0      SAB  0   IAB  0   Ectopic  0   Multiple  0   Live Births  0            Home Medications    Prior to Admission medications   Medication Sig Start Date End Date Taking? Authorizing Provider  naproxen (NAPROSYN) 500 MG tablet Take 1 tablet (500 mg total) by mouth 2 (two) times daily with a meal. 05/10/21   Jaynee Eagles, PA-C  tiZANidine (ZANAFLEX) 4 MG tablet Take 1 tablet (4 mg total) by mouth at bedtime. 05/10/21   Jaynee Eagles, PA-C    Family History Family History  Problem Relation Age of Onset   Hyperlipidemia Mother    Hypertension Mother    Hypertension Father    Hyperlipidemia Father     Social History Social History   Tobacco Use   Smoking status: Never   Smokeless tobacco: Never  Vaping Use   Vaping Use: Never used   Substance Use Topics   Alcohol use: No   Drug use: Not Currently    Types: Marijuana     Allergies   Patient has no known allergies.   Review of Systems Review of Systems Defer to HPI    Physical Exam Triage Vital Signs ED Triage Vitals  Enc Vitals Group     BP 06/02/22 1004 (!) 143/78     Pulse Rate 06/02/22 1004 94     Resp 06/02/22 1004 20     Temp 06/02/22 1004 97.9 F (36.6 C)     Temp Source 06/02/22 1004 Oral     SpO2 06/02/22 1004 97 %     Weight --      Height --      Head Circumference --      Peak Flow --      Pain Score 06/02/22 1006 2     Pain Loc --      Pain Edu? --      Excl. in [place]? --    No data found.  Updated Vital Signs BP (!) 143/78 (BP Location: Left Arm)   Pulse 94   Temp 97.9 F (36.6 C) (Oral)   Resp 20   LMP  05/17/2022 (Exact Date)   SpO2 97%   Visual Acuity Right Eye Distance:   Left Eye Distance:   Bilateral Distance:    Right Eye Near:   Left Eye Near:    Bilateral Near:     Physical Exam Constitutional:      Appearance: Normal appearance.  HENT:     Head: Normocephalic.     Right Ear: Tympanic membrane, ear canal and external ear normal.     Left Ear: Tympanic membrane, ear canal and external ear normal.     Nose: Congestion and rhinorrhea present.     Mouth/Throat:     Mouth: Mucous membranes are moist.     Pharynx: Posterior oropharyngeal erythema present.  Eyes:     Extraocular Movements: Extraocular movements intact.  Cardiovascular:     Rate and Rhythm: Normal rate and regular rhythm.     Pulses: Normal pulses.     Heart sounds: Normal heart sounds.  Pulmonary:     Effort: Pulmonary effort is normal.     Breath sounds: Normal breath sounds.  Neurological:     Mental Status: She is alert and oriented to person, place, and time. Mental status is at baseline.      UC Treatments / Results  Labs (all labs ordered are listed, but only abnormal results are displayed) Labs Reviewed - No data to  display  EKG   Radiology DG Chest 2 View  Result Date: 06/02/2022 CLINICAL DATA:  Cough and congestion. EXAM: CHEST - 2 VIEW COMPARISON:  Radiograph 05/10/2021 FINDINGS: The cardiomediastinal contours are normal. The lungs are clear. Pulmonary vasculature is normal. No consolidation, pleural effusion, or pneumothorax. No acute osseous abnormalities are seen. IMPRESSION: Negative radiographs of the chest. Electronically Signed   By: Keith Rake M.D.   On: 06/02/2022 10:30    Procedures Procedures (including critical care time)  Medications Ordered in UC Medications - No data to display  Initial Impression / Assessment and Plan / UC Course  I have reviewed the triage vital signs and the nursing notes.  Pertinent labs & imaging results that were available during my care of the patient were reviewed by me and considered in my medical decision making (see chart for details).  Acute upper respiratory infection  Patient is in no signs of distress nor toxic appearing.  Vital signs are stable.  Low suspicion for pneumonia, pneumothorax or bronchitis and therefore will defer imaging.  Prescribed Augmentin, declined additional medications for supportive care.May use additional over-the-counter medications as needed for supportive care.  May follow-up with urgent care as needed if symptoms persist or worsen.   Final Clinical Impressions(s) / UC Diagnoses   Final diagnoses:  None     Discharge Instructions      Chest x-ray is negative  symptoms have been present for at least 2 months we will provide you an antibiotic as a bacteria is most likely aiding to your symptoms being prolonged  Begin Augmentin every morning and every evening for 10 days, daily will take 48 hours for your medication to take effect and then you will see steady improvement from there    You can take Tylenol and/or Ibuprofen as needed for fever reduction and pain relief.   For cough: honey 1/2 to 1 teaspoon (you  can dilute the honey in water or another fluid).  You can also use guaifenesin and dextromethorphan for cough. You can use a humidifier for chest congestion and cough.  If you don't have a humidifier, you  can sit in the bathroom with the hot shower running.      For sore throat: try warm salt water gargles, cepacol lozenges, throat spray, warm tea or water with lemon/honey, popsicles or ice, or OTC cold relief medicine for throat discomfort.   For congestion: take a daily anti-histamine like Zyrtec, Claritin, and a oral decongestant, such as pseudoephedrine.  You can also use Flonase 1-2 sprays in each nostril daily.   It is important to stay hydrated: drink plenty of fluids (water, gatorade/powerade/pedialyte, juices, or teas) to keep your throat moisturized and help further relieve irritation/discomfort.    ED Prescriptions   None    PDMP not reviewed this encounter.   Hans Eden, NP 06/03/22 1008

## 2022-06-02 NOTE — ED Triage Notes (Signed)
Pt reports she still has a cough, green mucus, trouble breathing, and mid  chest pain x 2 months.  Pt also has pain and reddish color in her left eye that started this morning

## 2022-08-08 ENCOUNTER — Ambulatory Visit
Admission: EM | Admit: 2022-08-08 | Discharge: 2022-08-08 | Disposition: A | Discharge status: Home / Self Care | Payer: BC Managed Care – PPO | Attending: Nurse Practitioner | Admitting: Nurse Practitioner

## 2022-08-08 DIAGNOSIS — J029 Acute pharyngitis, unspecified: Secondary | ICD-10-CM | POA: Insufficient documentation

## 2022-08-08 LAB — POCT RAPID STREP A (OFFICE): Rapid Strep A Screen: NEGATIVE

## 2022-08-08 LAB — POCT MONO SCREEN (KUC): Mono, POC: NEGATIVE

## 2022-08-08 MED ORDER — CEFDINIR 300 MG PO CAPS: 300.0000 mg | ORAL_CAPSULE | Freq: Two times a day (BID) | ORAL | 0 refills | Status: AC

## 2022-08-08 NOTE — ED Provider Notes (Signed)
[name]    CSN: [id number] Arrival date & time: 08/08/22  1626      History   Chief Complaint Chief Complaint  Patient presents with   Sore Throat    HPI Molly Dominguez is a 22 y.o. female.   Patient presents with sore throat that started yesterday.  Denies any nasal congestion, runny nose, cough, fever.  Denies any known sick contacts.  Patient has not taken any medications to help alleviate symptoms.  Patient's blood pressure is mildly elevated but patient denies history of hypertension.  Patient not reporting any chest pain, shortness of breath, headache, dizziness, nausea, vomiting.   Sore Throat    History reviewed. No pertinent past medical history.  Patient Active Problem List   Diagnosis Date Noted   Healthcare maintenance 01/21/2017   Obesity (BMI 35.0-39.9 without comorbidity) 01/21/2017    History reviewed. No pertinent surgical history.  OB History     Gravida  0   Para  0   Term  0   Preterm  0   AB  0   Living  0      SAB  0   IAB  0   Ectopic  0   Multiple  0   Live Births  0            Home Medications    Prior to Admission medications   Medication Sig Start Date End Date Taking? Authorizing Provider  cefdinir (OMNICEF) 300 MG capsule Take 1 capsule (300 mg total) by mouth 2 (two) times daily for 10 days. 08/08/22 08/18/22 Yes Enisa Runyan, Acie Fredrickson, FNP  naproxen (NAPROSYN) 500 MG tablet Take 1 tablet (500 mg total) by mouth 2 (two) times daily with a meal. 05/10/21   Wallis Bamberg, PA-C  tiZANidine (ZANAFLEX) 4 MG tablet Take 1 tablet (4 mg total) by mouth at bedtime. 05/10/21   Wallis Bamberg, PA-C    Family History Family History  Problem Relation Age of Onset   Hyperlipidemia Mother    Hypertension Mother    Hypertension Father    Hyperlipidemia Father     Social History Social History   Tobacco Use   Smoking status: Never   Smokeless tobacco: Never  Vaping Use   Vaping Use: Never used  Substance Use Topics    Alcohol use: No   Drug use: Not Currently    Types: Marijuana     Allergies   Patient has no known allergies.   Review of Systems Review of Systems Per HPI  Physical Exam Triage Vital Signs ED Triage Vitals [08/08/22 1713]  Enc Vitals Group     BP (!) 168/111     Pulse Rate 100     Resp 16     Temp 98.2 F (36.8 C)     Temp Source Oral     SpO2 98 %     Weight      Height      Head Circumference      Peak Flow      Pain Score 4     Pain Loc      Pain Edu?      Excl. in [place]?    No data found.  Updated Vital Signs BP (!) 146/87   Pulse 100   Temp 98.2 F (36.8 C) (Oral)   Resp 16   SpO2 98%   Visual Acuity Right Eye Distance:   Left Eye Distance:   Bilateral Distance:    Right Eye Near:  Left Eye Near:    Bilateral Near:     Physical Exam Constitutional:      General: She is not in acute distress.    Appearance: Normal appearance. She is not toxic-appearing or diaphoretic.  HENT:     Head: Normocephalic and atraumatic.     Right Ear: Tympanic membrane and ear canal normal.     Left Ear: Tympanic membrane and ear canal normal.     Nose: No congestion.     Mouth/Throat:     Mouth: Mucous membranes are moist.     Pharynx: Posterior oropharyngeal erythema present. No oropharyngeal exudate or uvula swelling.     Tonsils: Tonsillar exudate present. No tonsillar abscesses.  Eyes:     Extraocular Movements: Extraocular movements intact.     Conjunctiva/sclera: Conjunctivae normal.     Pupils: Pupils are equal, round, and reactive to light.  Cardiovascular:     Rate and Rhythm: Normal rate and regular rhythm.     Pulses: Normal pulses.     Heart sounds: Normal heart sounds.  Pulmonary:     Effort: Pulmonary effort is normal. No respiratory distress.     Breath sounds: Normal breath sounds. No wheezing.  Abdominal:     General: Abdomen is flat. Bowel sounds are normal.     Palpations: Abdomen is soft.  Musculoskeletal:        General: Normal  range of motion.     Cervical back: Normal range of motion.  Skin:    General: Skin is warm and dry.  Neurological:     General: No focal deficit present.     Mental Status: She is alert and oriented to person, place, and time. Mental status is at baseline.  Psychiatric:        Mood and Affect: Mood normal.        Behavior: Behavior normal.      UC Treatments / Results  Labs (all labs ordered are listed, but only abnormal results are displayed) Labs Reviewed  CULTURE, GROUP A STREP [facility])  POCT RAPID STREP A (OFFICE)  POCT MONO SCREEN ([facility])    EKG   Radiology No results found.  Procedures Procedures (including critical care time)  Medications Ordered in UC Medications - No data to display  Initial Impression / Assessment and Plan / UC Course  I have reviewed the triage vital signs and the nursing notes.  Pertinent labs & imaging results that were available during my care of the patient were reviewed by me and considered in my medical decision making (see chart for details).     Rapid strep is negative but I am still highly suspicious of strep throat given appearance of posterior pharynx on exam.  There is no signs of peritonsillar abscess.  Awaiting throat culture.  Given suspicion, will opt to treat with cefdinir antibiotic given patient recently had Augmentin approximately 2 months ago.  Rapid mono was negative.  Discussed with patient that there is a possibility that etiology could be viral and supportive care related to this.  Patient's blood pressure is also mildly elevated but this appears possibly baseline.  Pain could also be contributing.  Patient is asymptomatic regarding blood pressure so encouraged patient to monitor blood pressure diligently at home over the next few days and follow-up with family medicine or urgent care if it remains elevated.  Advised strict return precautions if symptoms persist or worsen.  Patient verbalized understanding and was agreeable  with plan. Final Clinical Impressions(s) / UC Diagnoses  Final diagnoses:  Sore throat     Discharge Instructions      Rapid strep and rapid mono were negative.  Throat culture is pending.  As we discussed, it appears that you may have strep throat so I am treating with antibiotic.  Take this medication with food.  Follow-up if any symptoms persist or worsen.     ED Prescriptions     Medication Sig Dispense Auth. Provider   cefdinir (OMNICEF) 300 MG capsule Take 1 capsule (300 mg total) by mouth 2 (two) times daily for 10 days. 20 capsule Gustavus Bryant, [place]      PDMP not reviewed this encounter.   Gustavus Bryant, [place] 08/08/22 [phone]

## 2022-08-08 NOTE — Discharge Instructions (Signed)
Rapid strep and rapid mono were negative.  Throat culture is pending.  As we discussed, it appears that you may have strep throat so I am treating with antibiotic.  Take this medication with food.  Follow-up if any symptoms persist or worsen.

## 2022-08-08 NOTE — ED Triage Notes (Signed)
Pt c/o sore throat onset ~ yesterday  

## 2022-08-12 LAB — CULTURE, GROUP A STREP (THRC)

## 2022-09-25 ENCOUNTER — Ambulatory Visit
Admission: EM | Admit: 2022-09-25 | Discharge: 2022-09-25 | Disposition: A | Discharge status: Home / Self Care | Payer: 59 | Attending: Internal Medicine | Admitting: Internal Medicine

## 2022-09-25 ENCOUNTER — Encounter: Payer: Self-pay | Admitting: Emergency Medicine

## 2022-09-25 ENCOUNTER — Other Ambulatory Visit: Payer: Self-pay

## 2022-09-25 DIAGNOSIS — J029 Acute pharyngitis, unspecified: Secondary | ICD-10-CM | POA: Insufficient documentation

## 2022-09-25 DIAGNOSIS — J069 Acute upper respiratory infection, unspecified: Secondary | ICD-10-CM | POA: Insufficient documentation

## 2022-09-25 LAB — POCT RAPID STREP A (OFFICE): Rapid Strep A Screen: NEGATIVE

## 2022-09-25 MED ORDER — FLUTICASONE PROPIONATE 50 MCG/ACT NA SUSP: 1.0000 | Freq: Every day | NASAL | 0 refills | Status: DC

## 2022-09-25 MED ORDER — BENZONATATE 100 MG PO CAPS: 100.0000 mg | ORAL_CAPSULE | Freq: Three times a day (TID) | ORAL | 0 refills | Status: DC | PRN

## 2022-09-25 NOTE — ED Provider Notes (Signed)
[name]    CSN: [id number] Arrival date & time: 09/25/22  1300      History   Chief Complaint Chief Complaint  Patient presents with   Cough    HPI Molly Dominguez is a 22 y.o. female.   Patient presents with 2-day history of cough and nasal congestion.  Reports sore throat as well.  Denies fever but does report some chills.  Denies any known sick contacts.  Patient has not taken any medications for symptoms.  Denies chest pain or shortness of breath.  Denies history of asthma.   Cough   History reviewed. No pertinent past medical history.  Patient Active Problem List   Diagnosis Date Noted   Healthcare maintenance 01/21/2017   Obesity (BMI 35.0-39.9 without comorbidity) 01/21/2017    History reviewed. No pertinent surgical history.  OB History     Gravida  0   Para  0   Term  0   Preterm  0   AB  0   Living  0      SAB  0   IAB  0   Ectopic  0   Multiple  0   Live Births  0            Home Medications    Prior to Admission medications   Medication Sig Start Date End Date Taking? Authorizing Provider  benzonatate (TESSALON) 100 MG capsule Take 1 capsule (100 mg total) by mouth every 8 (eight) hours as needed for cough. 09/25/22  Yes Sahej Hauswirth, Rolly Salter E, FNP  fluticasone (FLONASE) 50 MCG/ACT nasal spray Place 1 spray into both nostrils daily. 09/25/22  Yes Areanna Gengler, Acie Fredrickson, FNP  naproxen (NAPROSYN) 500 MG tablet Take 1 tablet (500 mg total) by mouth 2 (two) times daily with a meal. 05/10/21   Wallis Bamberg, PA-C  tiZANidine (ZANAFLEX) 4 MG tablet Take 1 tablet (4 mg total) by mouth at bedtime. 05/10/21   Wallis Bamberg, PA-C    Family History Family History  Problem Relation Age of Onset   Hyperlipidemia Mother    Hypertension Mother    Hypertension Father    Hyperlipidemia Father     Social History Social History   Tobacco Use   Smoking status: Never   Smokeless tobacco: Never  Vaping Use   Vaping Use: Never used  Substance Use  Topics   Alcohol use: No   Drug use: Not Currently    Types: Marijuana     Allergies   Patient has no known allergies.   Review of Systems Review of Systems Per HPI  Physical Exam Triage Vital Signs ED Triage Vitals  Enc Vitals Group     BP 09/25/22 1308 (!) 155/84     Pulse Rate 09/25/22 1308 98     Resp 09/25/22 1308 18     Temp 09/25/22 1308 98.3 F (36.8 C)     Temp Source 09/25/22 1308 Oral     SpO2 09/25/22 1308 96 %     Weight --      Height --      Head Circumference --      Peak Flow --      Pain Score 09/25/22 1309 4     Pain Loc --      Pain Edu? --      Excl. in GC? --    No data found.  Updated Vital Signs BP (!) 155/84 (BP Location: Left Arm)   Pulse 98   Temp 98.3 F (  36.8 C) (Oral)   Resp 18   SpO2 96%   Visual Acuity Right Eye Distance:   Left Eye Distance:   Bilateral Distance:    Right Eye Near:   Left Eye Near:    Bilateral Near:     Physical Exam Constitutional:      General: She is not in acute distress.    Appearance: Normal appearance. She is not toxic-appearing or diaphoretic.  HENT:     Head: Normocephalic and atraumatic.     Right Ear: Tympanic membrane and ear canal normal.     Left Ear: Tympanic membrane and ear canal normal.     Nose: Congestion present.     Mouth/Throat:     Mouth: Mucous membranes are moist.     Pharynx: Posterior oropharyngeal erythema present.  Eyes:     Extraocular Movements: Extraocular movements intact.     Conjunctiva/sclera: Conjunctivae normal.     Pupils: Pupils are equal, round, and reactive to light.  Cardiovascular:     Rate and Rhythm: Normal rate and regular rhythm.     Pulses: Normal pulses.     Heart sounds: Normal heart sounds.  Pulmonary:     Effort: Pulmonary effort is normal. No respiratory distress.     Breath sounds: Normal breath sounds. No stridor. No wheezing, rhonchi or rales.  Abdominal:     General: Abdomen is flat. Bowel sounds are normal.     Palpations:  Abdomen is soft.  Musculoskeletal:        General: Normal range of motion.     Cervical back: Normal range of motion.  Skin:    General: Skin is warm and dry.  Neurological:     General: No focal deficit present.     Mental Status: She is alert and oriented to person, place, and time. Mental status is at baseline.  Psychiatric:        Mood and Affect: Mood normal.        Behavior: Behavior normal.      UC Treatments / Results  Labs (all labs ordered are listed, but only abnormal results are displayed) Labs Reviewed  CULTURE, GROUP A STREP [facility])  POCT RAPID STREP A (OFFICE)    EKG   Radiology No results found.  Procedures Procedures (including critical care time)  Medications Ordered in UC Medications - No data to display  Initial Impression / Assessment and Plan / UC Course  I have reviewed the triage vital signs and the nursing notes.  Pertinent labs & imaging results that were available during my care of the patient were reviewed by me and considered in my medical decision making (see chart for details).     Patient presents with symptoms likely from a viral upper respiratory infection.  Do not suspect underlying cardiopulmonary process. Symptoms seem unlikely related to ACS, CHF or COPD exacerbations, pneumonia, pneumothorax. Patient is nontoxic appearing and not in need of emergent medical intervention. Rapid strep was negative. Patient declined covid testing.   Recommended symptom control with medications and supportive care. Patient was sent flonase and benzonatate.   BP is slightly elevated. Encouraged patient to monitor at home and follow up with PCP if it remains elevated.   Return if symptoms fail to improve in 1-2 weeks or you develop shortness of breath, chest pain, severe headache. Patient states understanding and is agreeable.  Discharged with PCP followup.  Final Clinical Impressions(s) / UC Diagnoses   Final diagnoses:  Viral upper respiratory  tract infection  with cough  Sore throat     Discharge Instructions      Strep is negative.  Throat culture is pending.  As we discussed, it appears that you have a viral illness that should run its course.  I have prescribed you 2 medications which should help alleviate your symptoms.  Ensure adequate fluid hydration.  Follow-up if any symptoms persist or worsen.     ED Prescriptions     Medication Sig Dispense Auth. Provider   fluticasone (FLONASE) 50 MCG/ACT nasal spray Place 1 spray into both nostrils daily. 16 g Rifka Ramey, Rolly Salter E, [place]   benzonatate (TESSALON) 100 MG capsule Take 1 capsule (100 mg total) by mouth every 8 (eight) hours as needed for cough. 21 capsule Savage, Acie Fredrickson, [place]      PDMP not reviewed this encounter.   Gustavus Bryant, [place] 09/25/22 1331

## 2022-09-25 NOTE — ED Triage Notes (Signed)
Pt sts cough and congestion x 3 days with HA

## 2022-09-25 NOTE — Discharge Instructions (Signed)
Strep is negative.  Throat culture is pending.  As we discussed, it appears that you have a viral illness that should run its course.  I have prescribed you 2 medications which should help alleviate your symptoms.  Ensure adequate fluid hydration.  Follow-up if any symptoms persist or worsen.

## 2022-09-27 LAB — CULTURE, GROUP A STREP (THRC)

## 2023-01-21 ENCOUNTER — Ambulatory Visit (INDEPENDENT_AMBULATORY_CARE_PROVIDER_SITE_OTHER): Payer: 59 | Admitting: Family

## 2023-01-21 ENCOUNTER — Encounter: Payer: Self-pay | Admitting: Family

## 2023-01-21 VITALS — BP 142/93 | HR 95 | Temp 98.2°F | Ht 63.0 in | Wt 249.8 lb

## 2023-01-21 DIAGNOSIS — N926 Irregular menstruation, unspecified: Secondary | ICD-10-CM | POA: Diagnosis not present

## 2023-01-21 DIAGNOSIS — Z7689 Persons encountering health services in other specified circumstances: Secondary | ICD-10-CM

## 2023-01-21 DIAGNOSIS — I10 Essential (primary) hypertension: Secondary | ICD-10-CM | POA: Diagnosis not present

## 2023-01-21 DIAGNOSIS — Z6839 Body mass index (BMI) 39.0-39.9, adult: Secondary | ICD-10-CM | POA: Diagnosis not present

## 2023-01-21 DIAGNOSIS — Z6841 Body Mass Index (BMI) 40.0 and over, adult: Secondary | ICD-10-CM

## 2023-01-21 DIAGNOSIS — R635 Abnormal weight gain: Secondary | ICD-10-CM | POA: Diagnosis not present

## 2023-01-21 MED ORDER — VALSARTAN 40 MG PO TABS: 40.0000 mg | ORAL_TABLET | Freq: Every day | ORAL | 1 refills | Status: DC

## 2023-01-21 NOTE — Progress Notes (Signed)
Subjective:    Molly Dominguez - 22 y.o. female MRN [id number]  Date of birth: 16-Sep-2000  HPI  Molly Dominguez is to establish care.   Current issues and/or concerns: - High blood pressure. States she has been seen several times at Urgent Care and had high blood pressure. Family history of high blood pressure. She does not monitor salt intake. She exercises routinely. She does not check blood pressure outside of office. She does not complain of red flag symptoms such as but not limited to chest pain, shortness of breath, worst headache of life, nausea/vomiting.  - Reports would like to lose 20 to 40 pounds. States she was 180 pounds in 2019. Reports she was established with [facility] MD and lost 10 pounds. States she discontinued services with them due to cost.  - Irregular periods. States thinks related to weight. She declines pregnancy test.  - No further issues/concerns for discussion today.  ROS per HPI     Health Maintenance:  Health Maintenance Due  Topic Date Due   CHLAMYDIA SCREENING  Never done   HIV Screening  Never done   HPV VACCINES (3 - 3-dose series) 01/24/2016   Hepatitis C Screening  Never done   Cervical Cancer Screening (Pap smear)  Never done   DTaP/Tdap/Td (7 - Td or Tdap) 10/30/2021   INFLUENZA VACCINE  Never done   COVID-19 Vaccine (1 - 2023-24 season) Never done     Past Medical History: Patient Active Problem List   Diagnosis Date Noted   Healthcare maintenance 01/21/2017   Obesity (BMI 35.0-39.9 without comorbidity) 01/21/2017      Social History   reports that she has never smoked. She has never used smokeless tobacco. She reports that she does not currently use drugs after having used the following drugs: Marijuana. She reports that she does not drink alcohol.   Family History  family history includes Hyperlipidemia in her father and mother; Hypertension in her father and mother.   Medications: reviewed and updated   Objective:   Physical  Exam BP (!) 142/93   Pulse 95   Temp 98.2 F (36.8 C) (Oral)   Ht 5\' 3"  (1.6 m)   Wt 249 lb 12.8 oz (113.3 kg)   LMP 12/22/2022 (Exact Date)   SpO2 98%   BMI 44.25 kg/m   Physical Exam HENT:     Head: Normocephalic and atraumatic.     Nose: Nose normal.     Mouth/Throat:     Mouth: Mucous membranes are moist.     Pharynx: Oropharynx is clear.  Eyes:     Extraocular Movements: Extraocular movements intact.     Conjunctiva/sclera: Conjunctivae normal.     Pupils: Pupils are equal, round, and reactive to light.  Cardiovascular:     Rate and Rhythm: Normal rate and regular rhythm.     Pulses: Normal pulses.     Heart sounds: Normal heart sounds.  Pulmonary:     Effort: Pulmonary effort is normal.     Breath sounds: Normal breath sounds.  Musculoskeletal:        General: Normal range of motion.     Cervical back: Normal range of motion and neck supple.  Neurological:     General: No focal deficit present.     Mental Status: She is alert and oriented to person, place, and time.  Psychiatric:        Mood and Affect: Mood normal.        Behavior: Behavior normal.  Assessment & Plan:  1. Encounter to establish care - Patient presents today to establish care. During the interim follow-up with primary provider as scheduled.  - Return for annual physical examination, labs, and health maintenance. Arrive fasting meaning having no food for at least 8 hours prior to appointment. You may have only water or black coffee. Please take scheduled medications as normal.  2. Primary hypertension - Blood pressure not at goal during today's visit. Patient asymptomatic without chest pressure, chest pain, palpitations, shortness of breath, worst headache of life, and any additional red flag symptoms. - Trial Valsartan as as prescribed.  - Routine screening.  - Counseled on blood pressure goal of less than 130/80, low-sodium, DASH diet, medication compliance, and 150 minutes of moderate  intensity exercise per week as tolerated. Counseled on medication adherence and adverse effects. - Follow-up with primary provider in 2 weeks or sooner if needed for blood pressure check.  - Basic Metabolic Panel - valsartan (DIOVAN) 40 MG tablet; Take 1 tablet (40 mg total) by mouth daily.  Dispense: 30 tablet; Refill: 1  3. Encounter for weight management 4. BMI 40.0-44.9, adult (HCC) 5. Weight gain - Referral to Medical Weight Management for evaluation/management.  - Amb Ref to Medical Weight Management  6. Irregular menses - Patient declined pregnancy test.  - Referral to Gynecology for evaluation/management. - Ambulatory referral to Gynecology    Patient was given clear instructions to go to Emergency Department or return to medical center if symptoms don't improve, worsen, or new problems develop.The patient verbalized understanding.  I discussed the assessment and treatment plan with the patient. The patient was provided an opportunity to ask questions and all were answered. The patient agreed with the plan and demonstrated an understanding of the instructions.   The patient was advised to call back or seek an in-person evaluation if the symptoms worsen or if the condition fails to improve as anticipated.    Ricky Stabs, NP 01/21/2023, 8:33 AM Primary Care at [facility]

## 2023-01-21 NOTE — Progress Notes (Signed)
Blood pressure and weight loss.  Patient states no other concerns to discuss.  Declined Flu vac.

## 2023-01-22 LAB — BASIC METABOLIC PANEL
BUN/Creatinine Ratio: 15 (ref 9–23)
BUN: 11 mg/dL (ref 6–20)
CO2: 21 mmol/L (ref 20–29)
Calcium: 9.4 mg/dL (ref 8.7–10.2)
Chloride: 103 mmol/L (ref 96–106)
Creatinine, Ser: 0.72 mg/dL (ref 0.57–1.00)
Glucose: 76 mg/dL (ref 70–99)
Potassium: 4.4 mmol/L (ref 3.5–5.2)
Sodium: 140 mmol/L (ref 134–144)
eGFR: 120 mL/min/{1.73_m2} (ref >59)

## 2023-02-11 ENCOUNTER — Encounter: Payer: Self-pay | Admitting: Family

## 2023-02-11 ENCOUNTER — Ambulatory Visit (INDEPENDENT_AMBULATORY_CARE_PROVIDER_SITE_OTHER): Payer: 59 | Admitting: Family

## 2023-02-11 VITALS — BP 139/82 | HR 99 | Temp 98.4°F | Ht 61.0 in | Wt 248.0 lb

## 2023-02-11 DIAGNOSIS — I10 Essential (primary) hypertension: Secondary | ICD-10-CM | POA: Diagnosis not present

## 2023-02-11 MED ORDER — VALSARTAN 40 MG PO TABS: 40.0000 mg | ORAL_TABLET | Freq: Every day | ORAL | 0 refills | Status: DC

## 2023-02-11 NOTE — Progress Notes (Signed)
Patient ID: Molly Dominguez, female    DOB: 06-Jun-2000  MRN: [id number]  CC: Chronic Conditions Follow-Up  Subjective: Molly Dominguez is a 22 y.o. female who presents for chronic conditions follow-up.   Her concerns today include:  Doing well on Valsartan, no issues/concerns. She does not complain of red flag symptoms such as but not limited to chest pain, shortness of breath, worst headache of life, nausea/vomiting.    Patient Active Problem List   Diagnosis Date Noted   Healthcare maintenance 01/21/2017   Obesity (BMI 35.0-39.9 without comorbidity) 01/21/2017     No current outpatient medications on file prior to visit.   No current facility-administered medications on file prior to visit.    No Known Allergies  Social History   Socioeconomic History   Marital status: Single    Spouse name: Not on file   Number of children: Not on file   Years of education: Not on file   Highest education level: Bachelor's degree (e.g., BA, AB, BS)  Occupational History   Not on file  Tobacco Use   Smoking status: Never   Smokeless tobacco: Never  Vaping Use   Vaping status: Never Used  Substance and Sexual Activity   Alcohol use: No   Drug use: Not Currently    Types: Marijuana   Sexual activity: Yes    Partners: Male    Birth control/protection: Condom    Comment: INTERCOUSE AGE 49, LESS THAN 5 SEXUAL PARTERNS  Other Topics Concern   Not on file  Social History Narrative   Not on file   Social Determinants of Health   Financial Resource Strain: Low Risk  (01/17/2023)   Overall Financial Resource Strain (CARDIA)    Difficulty of Paying Living Expenses: Not very hard  Food Insecurity: No Food Insecurity (01/17/2023)   Hunger Vital Sign    Worried About Running Out of Food in the Last Year: Never true    Ran Out of Food in the Last Year: Never true  Transportation Needs: No Transportation Needs (01/17/2023)   PRAPARE - Administrator, Civil Service (Medical):  No    Lack of Transportation (Non-Medical): No  Physical Activity: Insufficiently Active (02/11/2023)   Exercise Vital Sign    Days of Exercise per Week: 2 days    Minutes of Exercise per Session: 60 min  Stress: No Stress Concern Present (01/17/2023)   [employer] of Occupational Health - Occupational Stress Questionnaire    Feeling of Stress : Only a little  Social Connections: Moderately Isolated (02/11/2023)   Social Connection and Isolation Panel [NHANES]    Frequency of Communication with Friends and Family: More than three times a week    Frequency of Social Gatherings with Friends and Family: More than three times a week    Attends Religious Services: More than 4 times per year    Active Member of [employer] or Organizations: No    Attends Banker Meetings: Never    Marital Status: Never married  Catering manager Violence: Not on file    Family History  Problem Relation Age of Onset   Hyperlipidemia Mother    Hypertension Mother    Hypertension Father    Hyperlipidemia Father     No past surgical history on file.  ROS: Review of Systems Negative except as stated above  PHYSICAL EXAM: BP 139/82   Pulse 99   Temp 98.4 F (36.9 C) (Oral)   Ht 5\' 1"  (1.549 m)  Wt 248 lb (112.5 kg)   LMP 02/10/2023 (Exact Date)   SpO2 98%   BMI 46.86 kg/m   Physical Exam HENT:     Head: Normocephalic and atraumatic.     Nose: Nose normal.     Mouth/Throat:     Mouth: Mucous membranes are moist.     Pharynx: Oropharynx is clear.  Eyes:     Extraocular Movements: Extraocular movements intact.     Conjunctiva/sclera: Conjunctivae normal.     Pupils: Pupils are equal, round, and reactive to light.  Cardiovascular:     Rate and Rhythm: Normal rate and regular rhythm.     Pulses: Normal pulses.     Heart sounds: Normal heart sounds.  Pulmonary:     Effort: Pulmonary effort is normal.     Breath sounds: Normal breath sounds.  Musculoskeletal:        General:  Normal range of motion.     Cervical back: Normal range of motion and neck supple.  Neurological:     General: No focal deficit present.     Mental Status: She is alert and oriented to person, place, and time.  Psychiatric:        Mood and Affect: Mood normal.        Behavior: Behavior normal.     ASSESSMENT AND PLAN: 1. Primary hypertension - Continue Valsartan as prescribed.  - Routine screening.  - Counseled on blood pressure goal of less than 130/80, low-sodium, DASH diet, medication compliance, and 150 minutes of moderate intensity exercise per week as tolerated. Counseled on medication adherence and adverse effects. - Follow-up with primary provider in 3 months or sooner if needed.  - valsartan (DIOVAN) 40 MG tablet; Take 1 tablet (40 mg total) by mouth daily.  Dispense: 90 tablet; Refill: 0 - Basic Metabolic Panel    Patient was given the opportunity to ask questions.  Patient verbalized understanding of the plan and was able to repeat key elements of the plan. Patient was given clear instructions to go to Emergency Department or return to medical center if symptoms don't improve, worsen, or new problems develop.The patient verbalized understanding.   Orders Placed This Encounter  Procedures   Basic Metabolic Panel     Requested Prescriptions   Signed Prescriptions Disp Refills   valsartan (DIOVAN) 40 MG tablet 90 tablet 0    Sig: Take 1 tablet (40 mg total) by mouth daily.    Return in about 3 months (around 05/14/2023) for Follow-Up or next available chronic conditions.  Rema Fendt, NP

## 2023-02-11 NOTE — Progress Notes (Signed)
Patient states no concerns to discuss

## 2023-02-12 ENCOUNTER — Other Ambulatory Visit: Payer: Self-pay

## 2023-02-12 DIAGNOSIS — I10 Essential (primary) hypertension: Secondary | ICD-10-CM

## 2023-02-12 LAB — BASIC METABOLIC PANEL
BUN/Creatinine Ratio: 16 (ref 9–23)
BUN: 11 mg/dL (ref 6–20)
CO2: 21 mmol/L (ref 20–29)
Calcium: 9.5 mg/dL (ref 8.7–10.2)
Chloride: 99 mmol/L (ref 96–106)
Creatinine, Ser: 0.69 mg/dL (ref 0.57–1.00)
Glucose: 91 mg/dL (ref 70–99)
Potassium: 4.3 mmol/L (ref 3.5–5.2)
Sodium: 137 mmol/L (ref 134–144)
eGFR: 125 mL/min/{1.73_m2} (ref >59)

## 2023-02-12 MED ORDER — VALSARTAN 40 MG PO TABS: 40.0000 mg | ORAL_TABLET | Freq: Every day | ORAL | 0 refills | Status: DC

## 2023-02-14 IMAGING — DX DG CHEST 2V
2 series · 2 of 2 positions shown · non-contrast
Comparison: None.

CLINICAL DATA: mid sternal chest pain

EXAM:
CHEST - 2 VIEW

[chest pa]
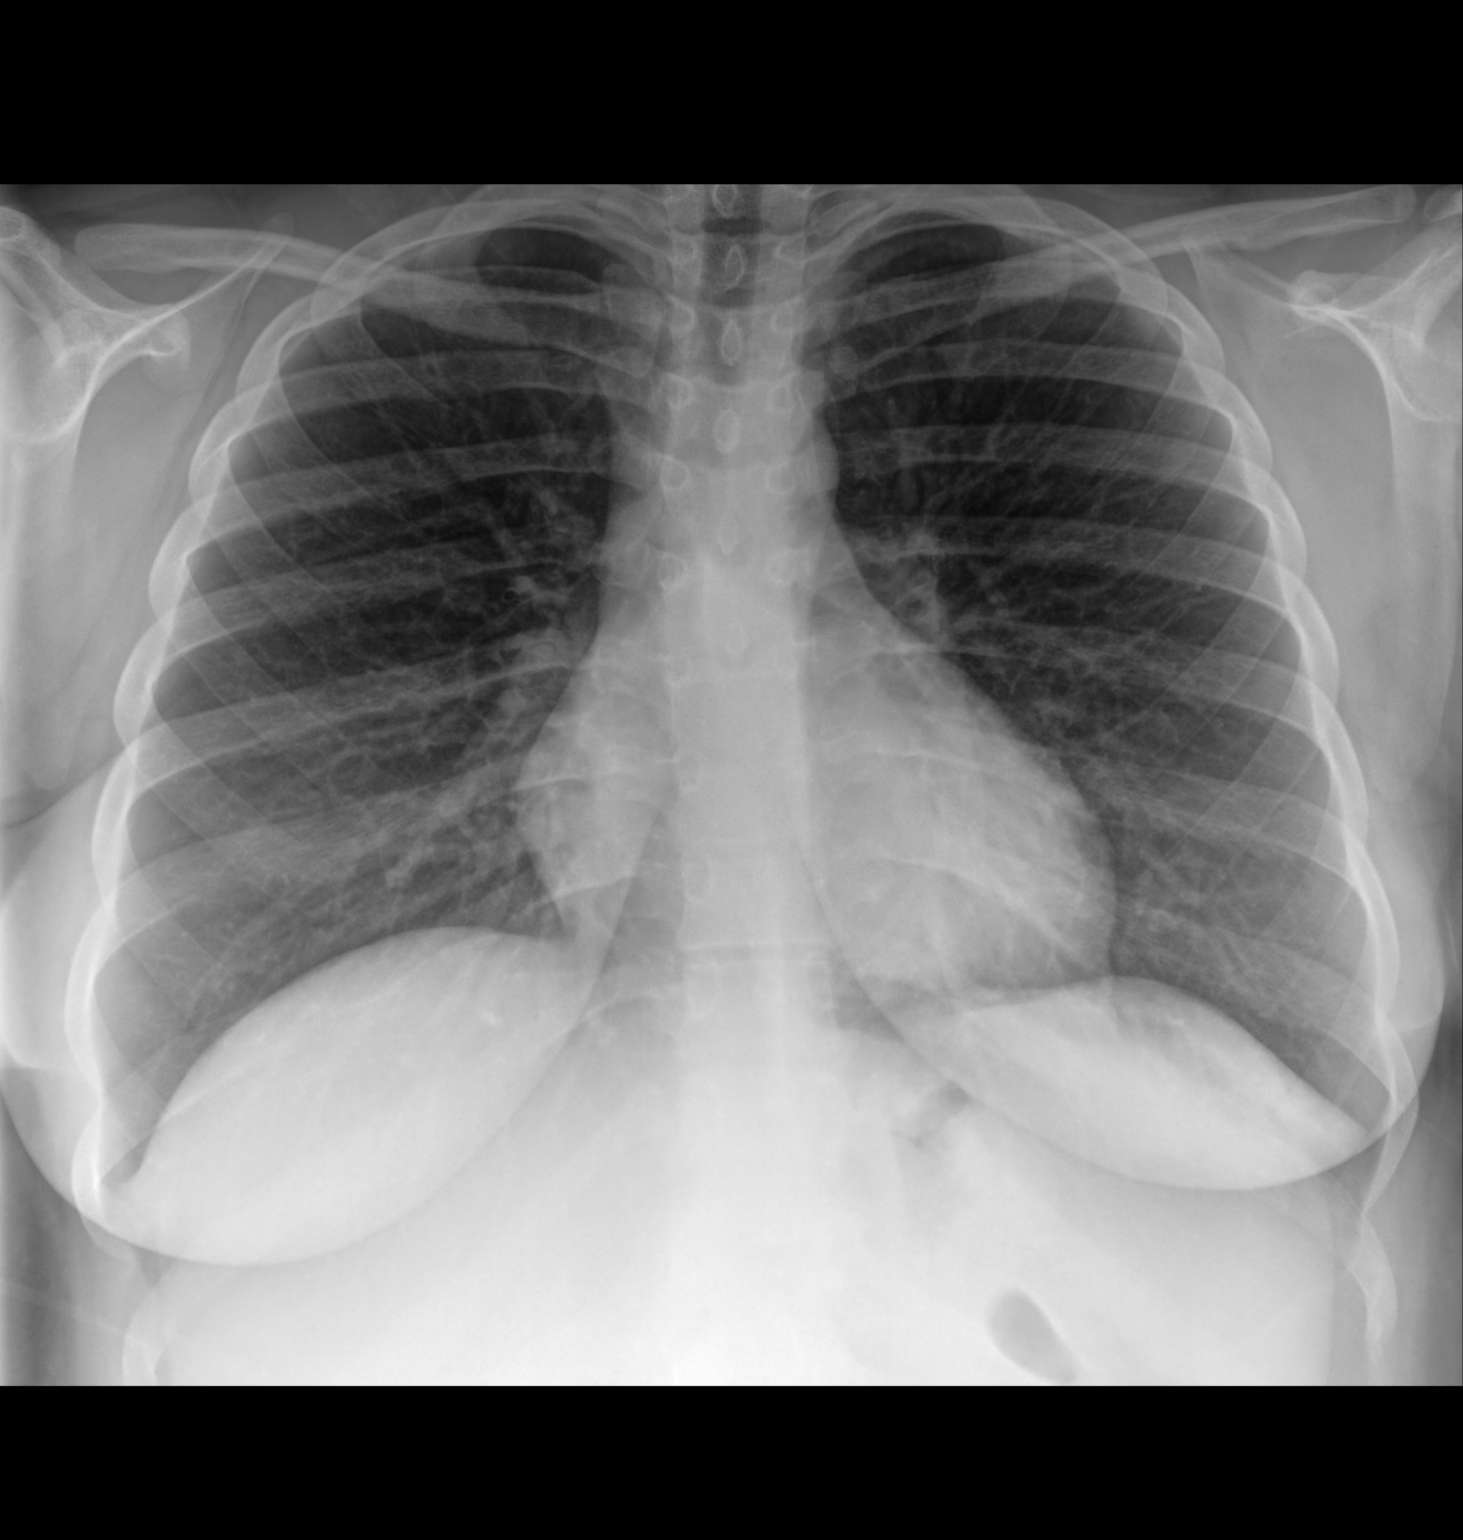

[chest lat]
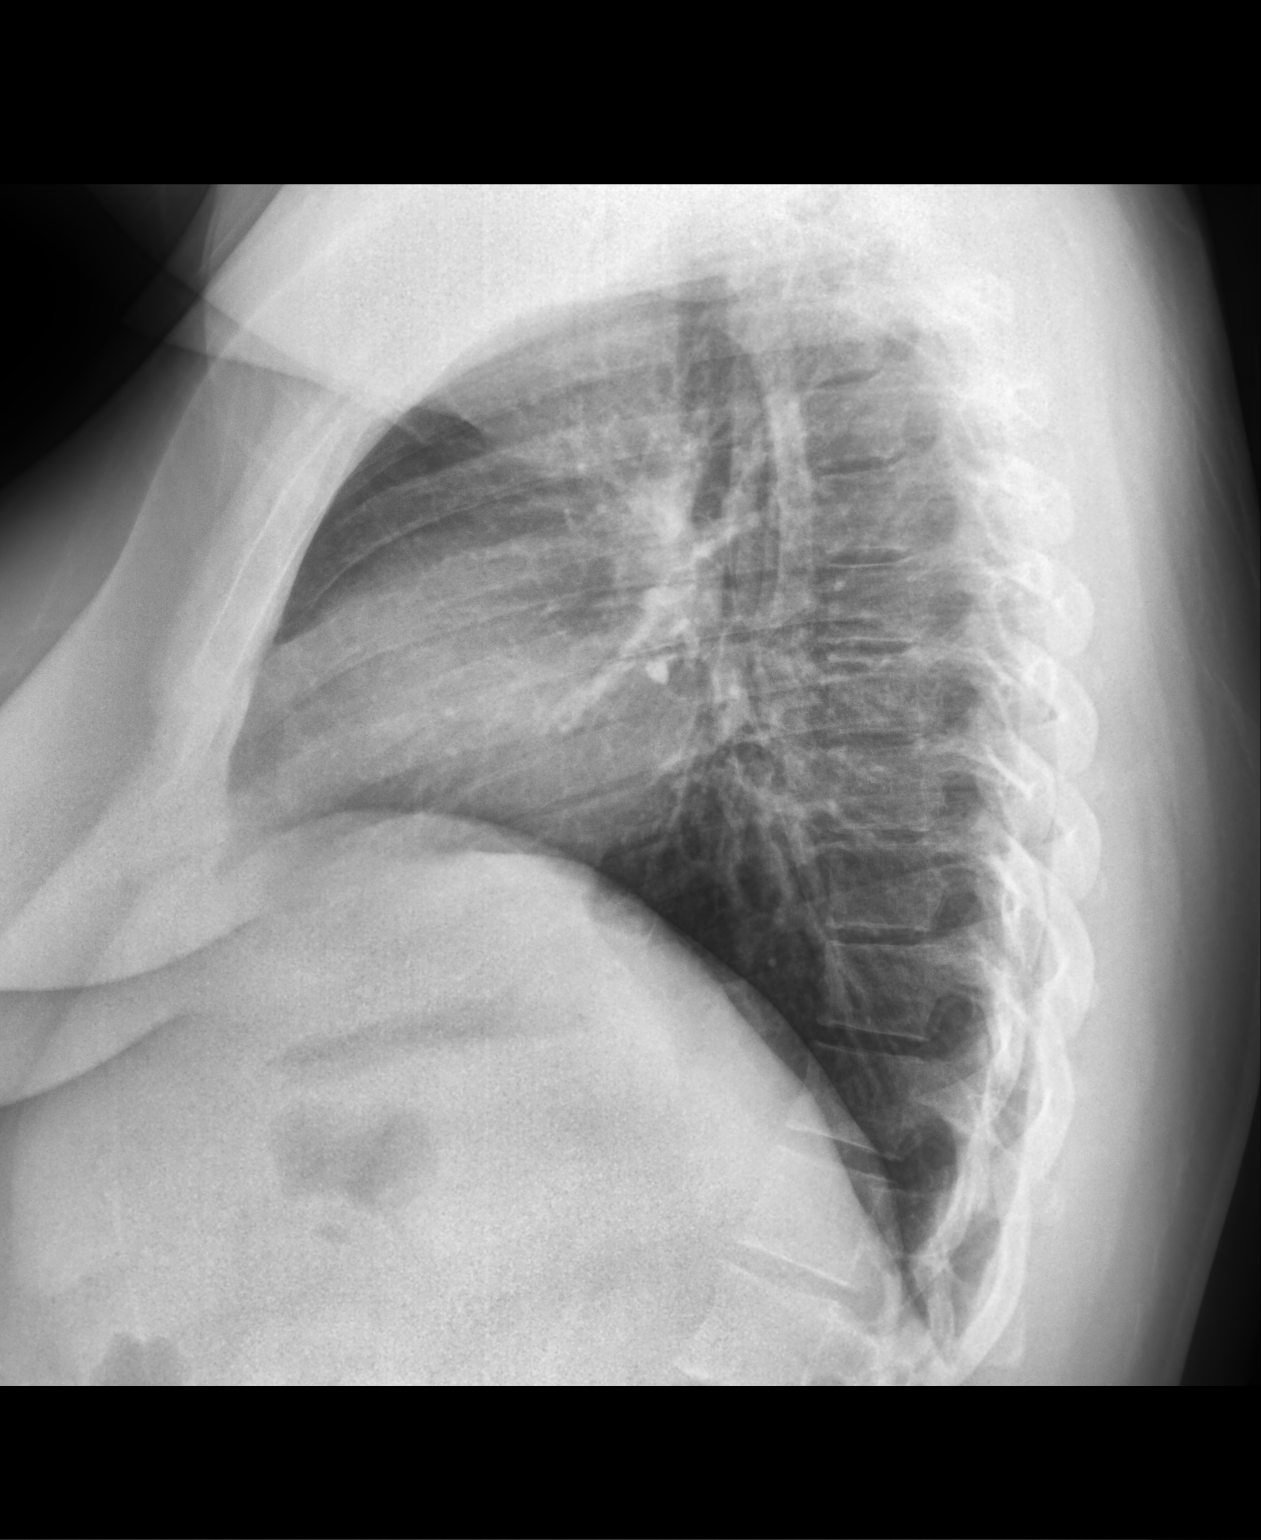

[2 of 2 positions shown; findings below may reference images not displayed]

FINDINGS: No consolidation. No visible pleural effusions or pneumothorax.
Cardiomediastinal silhouette is within normal limits. No evidence of
acute osseous abnormality with limited visualization of the sternum.
IMPRESSION: 1. No evidence of acute cardiopulmonary disease.
2. Limited visualization of the sternum. Dedicated sternum
radiographs or chest CT could provide more sensitive evaluation if
clinically indicated.

## 2023-02-15 NOTE — Telephone Encounter (Signed)
Valsartan sent to patient's preferred pharmacy on 02/12/2023 at 11:05 am.

## 2023-03-03 ENCOUNTER — Encounter: Payer: Self-pay | Admitting: Family

## 2023-03-03 ENCOUNTER — Ambulatory Visit
Admission: EM | Admit: 2023-03-03 | Discharge: 2023-03-03 | Disposition: A | Discharge status: Home / Self Care | Payer: 59

## 2023-03-03 ENCOUNTER — Encounter: Payer: Self-pay | Admitting: *Deleted

## 2023-03-03 ENCOUNTER — Other Ambulatory Visit: Payer: Self-pay

## 2023-03-03 DIAGNOSIS — R079 Chest pain, unspecified: Secondary | ICD-10-CM

## 2023-03-03 DIAGNOSIS — R42 Dizziness and giddiness: Secondary | ICD-10-CM

## 2023-03-03 HISTORY — DX: Essential (primary) hypertension: I10

## 2023-03-03 NOTE — ED Triage Notes (Signed)
Pt reports intermittent chest pain x 1 week. States no pain at present but she had it earlier today. States "really sharp, aching stabbing pain" and "pressure". Also endorses "dizzy spells". She takes valsartan at night, last dose was 10 pm last night. BP 166/99 pt per home BP cuff

## 2023-03-03 NOTE — ED Provider Notes (Signed)
[name]    CSN: [id number] Arrival date & time: 03/03/23  1201      History   Chief Complaint Chief Complaint  Patient presents with   Chest Pain    HPI Molly Dominguez is a 22 y.o. female.  1 week history of intermittent chest discomfort and lightheadedness. Can occur at rest. Has not noticed alleviating/aggravating factors She is not currently having the symptoms No history of this Denies new medications, foods, changes in life Does report anxiety   hx HTN on valsartan  Past Medical History:  Diagnosis Date   Hypertension     Patient Active Problem List   Diagnosis Date Noted   Healthcare maintenance 01/21/2017   Obesity (BMI 35.0-39.9 without comorbidity) 01/21/2017    History reviewed. No pertinent surgical history.  OB History     Gravida  0   Para  0   Term  0   Preterm  0   AB  0   Living  0      SAB  0   IAB  0   Ectopic  0   Multiple  0   Live Births  0            Home Medications    Prior to Admission medications   Medication Sig Start Date End Date Taking? Authorizing Provider  valsartan (DIOVAN) 40 MG tablet Take 1 tablet (40 mg total) by mouth daily. 02/12/23 05/13/23  Rema Fendt, NP    Family History Family History  Problem Relation Age of Onset   Hyperlipidemia Mother    Hypertension Mother    Hypertension Father    Hyperlipidemia Father     Social History Social History   Tobacco Use   Smoking status: Never   Smokeless tobacco: Never  Vaping Use   Vaping status: Never Used  Substance Use Topics   Alcohol use: No   Drug use: Not Currently    Types: Marijuana     Allergies   Patient has no known allergies.   Review of Systems Review of Systems Per HPI  Physical Exam Triage Vital Signs ED Triage Vitals  Encounter Vitals Group     BP 03/03/23 1242 130/82     Systolic BP Percentile --      Diastolic BP Percentile --      Pulse Rate 03/03/23 1242 84     Resp 03/03/23 1242  18     Temp 03/03/23 1242 98.1 F (36.7 C)     Temp src --      SpO2 03/03/23 1242 98 %     Weight --      Height --      Head Circumference --      Peak Flow --      Pain Score 03/03/23 1239 0     Pain Loc --      Pain Education --      Exclude from Growth Chart --    No data found.  Updated Vital Signs BP 130/82 (BP Location: Left Arm)   Pulse 84   Temp 98.1 F (36.7 C)   Resp 18   LMP 02/10/2023 (Exact Date)   SpO2 98%    Physical Exam Vitals and nursing note reviewed.  Constitutional:      General: She is not in acute distress.    Appearance: She is not ill-appearing.  HENT:     Head: Normocephalic and atraumatic.     Right Ear: Tympanic membrane and  ear canal normal.     Left Ear: Tympanic membrane and ear canal normal.     Nose: Nose normal.     Mouth/Throat:     Mouth: Mucous membranes are moist.     Pharynx: Oropharynx is clear.  Eyes:     Extraocular Movements: Extraocular movements intact.     Conjunctiva/sclera: Conjunctivae normal.     Pupils: Pupils are equal, round, and reactive to light.  Cardiovascular:     Rate and Rhythm: Normal rate and regular rhythm.     Pulses: Normal pulses.          Radial pulses are 2+ on the right side and 2+ on the left side.     Heart sounds: Normal heart sounds.  Pulmonary:     Effort: Pulmonary effort is normal. No respiratory distress.     Breath sounds: Normal breath sounds.  Abdominal:     Palpations: Abdomen is soft.     Tenderness: There is no abdominal tenderness.  Musculoskeletal:        General: Normal range of motion.     Cervical back: Normal range of motion.  Skin:    General: Skin is warm and dry.  Neurological:     Mental Status: She is alert and oriented to person, place, and time.     Sensory: No sensory deficit.     Motor: No weakness.     Coordination: Coordination normal. Finger-Nose-Finger Test normal.     Gait: Gait normal.     Comments: Strength 5 out of 5 throughout, sensation intact      UC Treatments / Results  Labs (all labs ordered are listed, but only abnormal results are displayed) Labs Reviewed - No data to display  EKG  Radiology No results found.  Procedures Procedures   Medications Ordered in UC Medications - No data to display  Initial Impression / Assessment and Plan / UC Course  I have reviewed the triage vital signs and the nursing notes.  Pertinent labs & imaging results that were available during my care of the patient were reviewed by me and considered in my medical decision making (see chart for details).  Well-appearing, stable vitals, not currently having her symptoms EKG normal sinus rhythm, ventricular rate of 84 bpm.  No ST or T wave abnormality Neurologically intact, no abnormality on exam.  Discussed reassuring findings with patient, intermittent for the last week, less likely to mean emergent etiology at this time.  Offered basic blood work, however patient had this done 2 weeks ago at PCP and would like to defer for now.  I recommend to monitor symptoms, keep a log of when they occur, call primary care for thing tomorrow morning to discuss follow-up.  Patient is agreeable to plan, no questions at this time.  Discussed any worsening or severe symptoms should be evaluated in the emergency department  Final Clinical Impressions(s) / UC Diagnoses   Final diagnoses:  Intermittent chest pain  Intermittent lightheadedness     Discharge Instructions      I recommend to monitor symptoms for the next few days.  You can keep a log of when your symptoms happen; what you are doing and how you feel etc. Drink lots of fluids and try to get some rest Please contact your primary care provider for follow-up If at any point your symptoms worsen or become severe, please go to the emergency department for evaluation    ED Prescriptions   None    PDMP  not reviewed this encounter.   Marlow Baars, [place] 03/03/23 1416

## 2023-03-03 NOTE — Discharge Instructions (Signed)
I recommend to monitor symptoms for the next few days.  You can keep a log of when your symptoms happen; what you are doing and how you feel etc. Drink lots of fluids and try to get some rest Please contact your primary care provider for follow-up If at any point your symptoms worsen or become severe, please go to the emergency department for evaluation

## 2023-03-04 NOTE — Telephone Encounter (Signed)
 Report to Emergency Department/Urgent Care/call 911 for immediate medical evaluation. Follow-up with Primary Care.

## 2023-04-01 ENCOUNTER — Encounter: Payer: Self-pay | Admitting: Emergency Medicine

## 2023-04-01 ENCOUNTER — Ambulatory Visit
Admission: EM | Admit: 2023-04-01 | Discharge: 2023-04-01 | Disposition: A | Discharge status: Home / Self Care | Payer: 59 | Attending: Physician Assistant | Admitting: Physician Assistant

## 2023-04-01 ENCOUNTER — Other Ambulatory Visit: Payer: Self-pay

## 2023-04-01 DIAGNOSIS — H65191 Other acute nonsuppurative otitis media, right ear: Secondary | ICD-10-CM | POA: Diagnosis not present

## 2023-04-01 DIAGNOSIS — J069 Acute upper respiratory infection, unspecified: Secondary | ICD-10-CM | POA: Diagnosis not present

## 2023-04-01 MED ORDER — AMOXICILLIN 500 MG PO CAPS: 500.0000 mg | ORAL_CAPSULE | Freq: Three times a day (TID) | ORAL | 0 refills | Status: AC

## 2023-04-01 NOTE — ED Provider Notes (Signed)
[name]    CSN: [id number] Arrival date & time: 04/01/23  0804      History   Chief Complaint Chief Complaint  Patient presents with   Sore Throat   Cough   Nausea    HPI Molly Dominguez is a 22 y.o. female.   Patient here today for evaluation of sore throat, cough, nausea, dizziness that started about 3 days ago.  She has not had any fever.  She reports she has taken over-the-counter medication with mild relief.  She reports her sore throat keeps her up at night.  The history is provided by the patient.    Past Medical History:  Diagnosis Date   Hypertension     Patient Active Problem List   Diagnosis Date Noted   Healthcare maintenance 01/21/2017   Obesity (BMI 35.0-39.9 without comorbidity) 01/21/2017    History reviewed. No pertinent surgical history.  OB History     Gravida  0   Para  0   Term  0   Preterm  0   AB  0   Living  0      SAB  0   IAB  0   Ectopic  0   Multiple  0   Live Births  0            Home Medications    Prior to Admission medications   Medication Sig Start Date End Date Taking? Authorizing Provider  amoxicillin (AMOXIL) 500 MG capsule Take 1 capsule (500 mg total) by mouth 3 (three) times daily for 10 days. 04/01/23 04/11/23 Yes Tomi Bamberger, PA-C  valsartan (DIOVAN) 40 MG tablet Take 1 tablet (40 mg total) by mouth daily. 02/12/23 05/13/23 Yes Rema Fendt, NP    Family History Family History  Problem Relation Age of Onset   Hyperlipidemia Mother    Hypertension Mother    Hypertension Father    Hyperlipidemia Father     Social History Social History   Tobacco Use   Smoking status: Never   Smokeless tobacco: Never  Vaping Use   Vaping status: Former  Substance Use Topics   Alcohol use: No   Drug use: Not Currently    Types: Marijuana     Allergies   Patient has no known allergies.   Review of Systems Review of Systems  Constitutional:  Negative for chills and fever.   HENT:  Positive for congestion and sore throat. Negative for ear pain.   Eyes:  Negative for discharge and redness.  Respiratory:  Positive for cough. Negative for shortness of breath and wheezing.   Gastrointestinal:  Negative for abdominal pain, diarrhea, nausea and vomiting.     Physical Exam Triage Vital Signs ED Triage Vitals  Encounter Vitals Group     BP      Systolic BP Percentile      Diastolic BP Percentile      Pulse      Resp      Temp      Temp src      SpO2      Weight      Height      Head Circumference      Peak Flow      Pain Score      Pain Loc      Pain Education      Exclude from Growth Chart    No data found.  Updated Vital Signs BP 123/77 (BP Location: Left Arm)  Pulse 98   Temp 98.1 F (36.7 C) (Oral)   Resp 16   LMP 02/01/2023 (Approximate)   SpO2 96%   Visual Acuity Right Eye Distance:   Left Eye Distance:   Bilateral Distance:    Right Eye Near:   Left Eye Near:    Bilateral Near:     Physical Exam Vitals and nursing note reviewed.  Constitutional:      General: She is not in acute distress.    Appearance: Normal appearance. She is not ill-appearing.  HENT:     Head: Normocephalic and atraumatic.     Left Ear: Tympanic membrane normal.     Ears:     Comments: Right TM erythematous    Nose: Congestion present.     Mouth/Throat:     Mouth: Mucous membranes are moist.     Pharynx: Posterior oropharyngeal erythema present. No oropharyngeal exudate.  Eyes:     Conjunctiva/sclera: Conjunctivae normal.  Cardiovascular:     Rate and Rhythm: Normal rate and regular rhythm.     Heart sounds: Normal heart sounds. No murmur heard. Pulmonary:     Effort: Pulmonary effort is normal. No respiratory distress.     Breath sounds: Normal breath sounds. No wheezing, rhonchi or rales.  Skin:    General: Skin is warm and dry.  Neurological:     Mental Status: She is alert.  Psychiatric:        Mood and Affect: Mood normal.         Thought Content: Thought content normal.      UC Treatments / Results  Labs (all labs ordered are listed, but only abnormal results are displayed) Labs Reviewed - No data to display  EKG   Radiology No results found.  Procedures Procedures (including critical care time)  Medications Ordered in UC Medications - No data to display  Initial Impression / Assessment and Plan / UC Course  I have reviewed the triage vital signs and the nursing notes.  Pertinent labs & imaging results that were available during my care of the patient were reviewed by me and considered in my medical decision making (see chart for details).    Amoxicillin prescribed to cover possible otitis media but also would cover strep so strep screening deferred today.  Discussed likely viral etiology of other upper respiratory symptoms and advise she continue to monitor and follow-up with any further concerns.  Final Clinical Impressions(s) / UC Diagnoses   Final diagnoses:  Acute upper respiratory infection  Other acute nonsuppurative otitis media of right ear, recurrence not specified   Discharge Instructions   None    ED Prescriptions     Medication Sig Dispense Auth. Provider   amoxicillin (AMOXIL) 500 MG capsule Take 1 capsule (500 mg total) by mouth 3 (three) times daily for 10 days. 30 capsule Tomi Bamberger, PA-C      PDMP not reviewed this encounter.   Tomi Bamberger, PA-C 04/01/23 [phone]

## 2023-04-01 NOTE — ED Triage Notes (Signed)
Pt reports sore throat, productive cough w/ green sputum, nausea, and episodes of dizziness x3 days. Some relief with dayquil/nyquil cold & flu at home. Denies fevers at home.

## 2023-04-09 ENCOUNTER — Ambulatory Visit (INDEPENDENT_AMBULATORY_CARE_PROVIDER_SITE_OTHER): Payer: 59 | Admitting: Family

## 2023-04-09 VITALS — BP 124/77 | HR 97 | Temp 98.0°F | Ht 61.0 in | Wt 247.2 lb

## 2023-04-09 DIAGNOSIS — Z130 Encounter for screening for diseases of the blood and blood-forming organs and certain disorders involving the immune mechanism: Secondary | ICD-10-CM | POA: Diagnosis not present

## 2023-04-09 DIAGNOSIS — Z13228 Encounter for screening for other metabolic disorders: Secondary | ICD-10-CM | POA: Diagnosis not present

## 2023-04-09 DIAGNOSIS — Z0000 Encounter for general adult medical examination without abnormal findings: Secondary | ICD-10-CM

## 2023-04-09 DIAGNOSIS — Z131 Encounter for screening for diabetes mellitus: Secondary | ICD-10-CM | POA: Diagnosis not present

## 2023-04-09 DIAGNOSIS — I10 Essential (primary) hypertension: Secondary | ICD-10-CM

## 2023-04-09 DIAGNOSIS — Z113 Encounter for screening for infections with a predominantly sexual mode of transmission: Secondary | ICD-10-CM

## 2023-04-09 DIAGNOSIS — Z124 Encounter for screening for malignant neoplasm of cervix: Secondary | ICD-10-CM

## 2023-04-09 DIAGNOSIS — Z13220 Encounter for screening for lipoid disorders: Secondary | ICD-10-CM

## 2023-04-09 DIAGNOSIS — Z1159 Encounter for screening for other viral diseases: Secondary | ICD-10-CM

## 2023-04-09 DIAGNOSIS — Z1329 Encounter for screening for other suspected endocrine disorder: Secondary | ICD-10-CM

## 2023-04-09 DIAGNOSIS — Z114 Encounter for screening for human immunodeficiency virus [HIV]: Secondary | ICD-10-CM

## 2023-04-09 MED ORDER — VALSARTAN 40 MG PO TABS: 40.0000 mg | ORAL_TABLET | Freq: Every day | ORAL | 0 refills | Status: DC

## 2023-04-09 NOTE — Progress Notes (Signed)
 Patient ID: Molly Dominguez, female    DOB: November 28, 2000  MRN: [id number]  CC: Annual Exam  Subjective: Molly Dominguez is a 23 y.o. female who presents for annual exam.   Her concerns today include: - Doing well on Valsartan , no issues/concerns. She does not complain of red flag symptoms such as but not limited to chest pain, shortness of breath, worst headache of life, nausea/vomiting.    Patient Active Problem List   Diagnosis Date Noted   Healthcare maintenance 01/21/2017   Obesity (BMI 35.0-39.9 without comorbidity) 01/21/2017     Current Outpatient Medications on File Prior to Visit  Medication Sig Dispense Refill   amoxicillin  (AMOXIL ) 500 MG capsule Take 1 capsule (500 mg total) by mouth 3 (three) times daily for 10 days. 30 capsule 0   No current facility-administered medications on file prior to visit.    No Known Allergies  Social History   Socioeconomic History   Marital status: Single    Spouse name: Not on file   Number of children: Not on file   Years of education: Not on file   Highest education level: Bachelor's degree (e.g., BA, AB, BS)  Occupational History   Not on file  Tobacco Use   Smoking status: Never   Smokeless tobacco: Never  Vaping Use   Vaping status: Former  Substance and Sexual Activity   Alcohol use: No   Drug use: Not Currently    Types: Marijuana   Sexual activity: Yes    Partners: Male    Birth control/protection: Condom    Comment: INTERCOUSE AGE 74, LESS THAN 5 SEXUAL PARTERNS  Other Topics Concern   Not on file  Social History Narrative   Not on file   Social Drivers of Health   Financial Resource Strain: Low Risk  (04/05/2023)   Overall Financial Resource Strain (CARDIA)    Difficulty of Paying Living Expenses: Not very hard  Food Insecurity: Food Insecurity Present (04/05/2023)   Hunger Vital Sign    Worried About Running Out of Food in the Last Year: Sometimes true    Ran Out of Food in the Last Year: Never true   Transportation Needs: No Transportation Needs (04/05/2023)   PRAPARE - Administrator, Civil Service (Medical): No    Lack of Transportation (Non-Medical): No  Physical Activity: Insufficiently Active (04/05/2023)   Exercise Vital Sign    Days of Exercise per Week: 2 days    Minutes of Exercise per Session: 30 min  Stress: No Stress Concern Present (04/05/2023)   [employer] of Occupational Health - Occupational Stress Questionnaire    Feeling of Stress : Not at all  Social Connections: Moderately Isolated (04/05/2023)   Social Connection and Isolation Panel [NHANES]    Frequency of Communication with Friends and Family: More than three times a week    Frequency of Social Gatherings with Friends and Family: More than three times a week    Attends Religious Services: More than 4 times per year    Active Member of [employer] or Organizations: No    Attends Banker Meetings: Never    Marital Status: Never married  Catering Manager Violence: Not on file    Family History  Problem Relation Age of Onset   Hyperlipidemia Mother    Hypertension Mother    Hypertension Father    Hyperlipidemia Father     No past surgical history on file.  ROS: Review of Systems Negative except as stated above  PHYSICAL EXAM: BP 124/77   Pulse 97   Temp 98 F (36.7 C) (Oral)   Ht 5' 1 (1.549 m)   Wt 247 lb 3.2 oz (112.1 kg)   LMP 04/07/2023 (Approximate)   SpO2 96%   BMI 46.71 kg/m   Physical Exam HENT:     Head: Normocephalic and atraumatic.     Right Ear: Tympanic membrane, ear canal and external ear normal.     Left Ear: Tympanic membrane, ear canal and external ear normal.     Nose: Nose normal.     Mouth/Throat:     Mouth: Mucous membranes are moist.     Pharynx: Oropharynx is clear.  Eyes:     Extraocular Movements: Extraocular movements intact.     Conjunctiva/sclera: Conjunctivae normal.     Pupils: Pupils are equal, round, and reactive to light.  Neck:      Thyroid: No thyroid mass, thyromegaly or thyroid tenderness.  Cardiovascular:     Rate and Rhythm: Normal rate and regular rhythm.     Pulses: Normal pulses.     Heart sounds: Normal heart sounds.  Pulmonary:     Effort: Pulmonary effort is normal.     Breath sounds: Normal breath sounds.  Chest:     Comments: Patient declined.  Abdominal:     General: Bowel sounds are normal.     Palpations: Abdomen is soft.  Genitourinary:    Comments: Patient declined.  Musculoskeletal:        General: Normal range of motion.     Right shoulder: Normal.     Left shoulder: Normal.     Right upper arm: Normal.     Left upper arm: Normal.     Right elbow: Normal.     Left elbow: Normal.     Right forearm: Normal.     Left forearm: Normal.     Right wrist: Normal.     Left wrist: Normal.     Right hand: Normal.     Left hand: Normal.     Cervical back: Normal, normal range of motion and neck supple.     Thoracic back: Normal.     Lumbar back: Normal.     Right hip: Normal.     Left hip: Normal.     Right upper leg: Normal.     Left upper leg: Normal.     Right knee: Normal.     Left knee: Normal.     Right lower leg: Normal.     Left lower leg: Normal.     Right ankle: Normal.     Left ankle: Normal.     Right foot: Normal.     Left foot: Normal.  Skin:    General: Skin is warm and dry.     Capillary Refill: Capillary refill takes less than 2 seconds.  Neurological:     General: No focal deficit present.     Mental Status: She is alert and oriented to person, place, and time.  Psychiatric:        Mood and Affect: Mood normal.        Behavior: Behavior normal.     ASSESSMENT AND PLAN: 1. Annual physical exam (Primary) - Counseled on 150 minutes of exercise per week as tolerated, healthy eating (including decreased daily intake of saturated fats, cholesterol, added sugars, sodium), STI prevention, and routine healthcare maintenance.  2. Screening for metabolic disorder -  Routine screening.  - Hepatic Function Panel  3. Screening for  deficiency anemia - Routine screening.  - CBC  4. Diabetes mellitus screening - Routine screening.  - Hemoglobin A1c  5. Screening cholesterol level - Routine screening.  - Lipid panel  6. Thyroid disorder screen - Routine screening.  - TSH  7. Pap smear for cervical cancer screening - Referral to Gynecology for evaluation/management. - Ambulatory referral to Gynecology  8. Routine screening for STI (sexually transmitted infection) - Routine screening.  - Cervicovaginal ancillary only  9. Encounter for screening for HIV - Routine screening.  - HIV antibody (with reflex)  10. Need for hepatitis C screening test - Routine screening.  - Hepatitis C Antibody  11. Primary hypertension - Continue Valsartan  as prescribed.  - Counseled on blood pressure goal of less than 130/80, low-sodium, DASH diet, medication compliance, and 150 minutes of moderate intensity exercise per week as tolerated. Counseled on medication adherence and adverse effects. - Follow-up with primary provider in 3 months or sooner if needed.  - valsartan  (DIOVAN ) 40 MG tablet; Take 1 tablet (40 mg total) by mouth daily.  Dispense: 90 tablet; Refill: 0    Patient was given the opportunity to ask questions.  Patient verbalized understanding of the plan and was able to repeat key elements of the plan. Patient was given clear instructions to go to Emergency Department or return to medical center if symptoms don't improve, worsen, or new problems develop.The patient verbalized understanding.   Orders Placed This Encounter  Procedures   Hepatitis C Antibody   HIV antibody (with reflex)   Hepatic Function Panel   CBC   Hemoglobin A1c   TSH   Lipid panel   Ambulatory referral to Gynecology     Requested Prescriptions   Signed Prescriptions Disp Refills   valsartan  (DIOVAN ) 40 MG tablet 90 tablet 0    Sig: Take 1 tablet (40 mg total) by  mouth daily.    Return in about 1 year (around 04/08/2024) for Physical per patient preference and 3 months chronic conditions .  Greig JINNY Drones, NP

## 2023-04-09 NOTE — Progress Notes (Signed)
 Marland Kitchen

## 2023-04-10 ENCOUNTER — Encounter: Payer: Self-pay | Admitting: Family

## 2023-04-10 ENCOUNTER — Other Ambulatory Visit: Payer: Self-pay | Admitting: Family

## 2023-04-10 DIAGNOSIS — E785 Hyperlipidemia, unspecified: Secondary | ICD-10-CM

## 2023-04-10 LAB — HEPATITIS C ANTIBODY: Hep C Virus Ab: NONREACTIVE

## 2023-04-10 LAB — HEMOGLOBIN A1C
Est. average glucose Bld gHb Est-mCnc: 105 mg/dL
Hgb A1c MFr Bld: 5.3 % (ref 4.8–5.6)

## 2023-04-10 LAB — CBC
Hematocrit: 45.5 % (ref 34.0–46.6)
Hemoglobin: 14.9 g/dL (ref 11.1–15.9)
MCH: 28.2 pg (ref 26.6–33.0)
MCHC: 32.7 g/dL (ref 31.5–35.7)
MCV: 86 fL (ref 79–97)
Platelets: 352 10*3/uL (ref 150–450)
RBC: 5.28 x10E6/uL (ref 3.77–5.28)
RDW: 13.1 % (ref 11.7–15.4)
WBC: 9.4 10*3/uL (ref 3.4–10.8)

## 2023-04-10 LAB — LIPID PANEL
Chol/HDL Ratio: 5.6 {ratio} — ABNORMAL HIGH (ref 0.0–4.4)
Cholesterol, Total: 224 mg/dL — ABNORMAL HIGH (ref 100–199)
HDL: 40 mg/dL (ref >39)
LDL Chol Calc (NIH): 153 mg/dL — ABNORMAL HIGH (ref 0–99)
Triglycerides: 171 mg/dL — ABNORMAL HIGH (ref 0–149)
VLDL Cholesterol Cal: 31 mg/dL (ref 5–40)

## 2023-04-10 LAB — HEPATIC FUNCTION PANEL
ALT: 13 [IU]/L (ref 0–32)
AST: 12 [IU]/L (ref 0–40)
Albumin: 4.4 g/dL (ref 4.0–5.0)
Alkaline Phosphatase: 111 [IU]/L (ref 44–121)
Bilirubin Total: 0.4 mg/dL (ref 0.0–1.2)
Bilirubin, Direct: 0.13 mg/dL (ref 0.00–0.40)
Total Protein: 7.4 g/dL (ref 6.0–8.5)

## 2023-04-10 LAB — TSH: TSH: 1.31 u[IU]/mL (ref 0.450–4.500)

## 2023-04-10 LAB — HIV ANTIBODY (ROUTINE TESTING W REFLEX): HIV Screen 4th Generation wRfx: NONREACTIVE

## 2023-04-10 MED ORDER — ATORVASTATIN CALCIUM 20 MG PO TABS: 20.0000 mg | ORAL_TABLET | Freq: Every day | ORAL | 0 refills | Status: DC

## 2023-04-11 NOTE — Telephone Encounter (Signed)
-   Continue with present lab result recommendations.  - Schedule appointment to discuss weight loss medications.

## 2023-05-14 ENCOUNTER — Ambulatory Visit: Payer: 59 | Admitting: Family

## 2023-06-04 ENCOUNTER — Other Ambulatory Visit (HOSPITAL_COMMUNITY)
Admission: RE | Admit: 2023-06-04 | Discharge: 2023-06-04 | Disposition: A | Discharge status: Home / Self Care | Source: Ambulatory Visit | Attending: Radiology | Admitting: Radiology

## 2023-06-04 ENCOUNTER — Encounter: Payer: Self-pay | Admitting: Radiology

## 2023-06-04 ENCOUNTER — Ambulatory Visit (INDEPENDENT_AMBULATORY_CARE_PROVIDER_SITE_OTHER): Payer: BC Managed Care – PPO | Admitting: Radiology

## 2023-06-04 ENCOUNTER — Encounter (INDEPENDENT_AMBULATORY_CARE_PROVIDER_SITE_OTHER): Payer: Self-pay

## 2023-06-04 VITALS — BP 116/82 | HR 90 | Ht 61.5 in | Wt 241.4 lb

## 2023-06-04 DIAGNOSIS — Z1331 Encounter for screening for depression: Secondary | ICD-10-CM | POA: Diagnosis not present

## 2023-06-04 DIAGNOSIS — Z01419 Encounter for gynecological examination (general) (routine) without abnormal findings: Secondary | ICD-10-CM | POA: Diagnosis present

## 2023-06-04 DIAGNOSIS — Z113 Encounter for screening for infections with a predominantly sexual mode of transmission: Secondary | ICD-10-CM | POA: Insufficient documentation

## 2023-06-04 DIAGNOSIS — Z30018 Encounter for initial prescription of other contraceptives: Secondary | ICD-10-CM

## 2023-06-04 MED ORDER — PHEXXI 1.8-1-0.4 % VA GEL: 5.0000 g | VAGINAL | 11 refills | Status: AC

## 2023-06-04 NOTE — Patient Instructions (Signed)

## 2023-06-04 NOTE — Progress Notes (Signed)
 Molly Dominguez April 11, 2000 [id number]   History:  23 y.o. G0 presents for annual exam. Would like a nonhomronal BC option. Periods were irregular, becoming more regular now that she is losing weight. No other gyn concerns. Exercises multiple times a week, eats low carb.  Gynecologic History Patient's last menstrual period was 05/16/2023 (exact date). Period Cycle (Days):  (skips periods, every 3 months) Period Duration (Days): 5 Period Pattern: (!) Irregular Menstrual Flow: Heavy Menstrual Control: Thin pad, Maxi pad Dysmenorrhea: (!) Severe Dysmenorrhea Symptoms: Cramping Contraception/Family planning: condoms Sexually active: yes Last Pap: never   Obstetric History OB History  Gravida Para Term Preterm AB Living  0 0 0 0 0 0  SAB IAB Ectopic Multiple Live Births  0 0 0 0 0       06/04/2023    8:42 AM 02/11/2023    4:08 PM 01/21/2023    8:06 AM  Depression screen PHQ 2/9  Decreased Interest 0 0 0  Down, Depressed, Hopeless 0 0 0  PHQ - 2 Score 0 0 0     The following portions of the patient's history were reviewed and updated as appropriate: allergies, current medications, past family history, past medical history, past social history, past surgical history, and problem list.  Review of Systems  All other systems reviewed and are negative.   Past medical history, past surgical history, family history and social history were all reviewed and documented in the EPIC chart.  Exam:  Vitals:   06/04/23 0840  BP: 116/82  Pulse: 90  SpO2: 99%  Weight: 241 lb 6.4 oz (109.5 kg)  Height: 5' 1.5" (1.562 m)   Body mass index is 44.87 kg/m.  Physical Exam Vitals and nursing note reviewed. Exam conducted with a chaperone present.  Constitutional:      Appearance: Normal appearance. She is obese.  HENT:     Head: Normocephalic and atraumatic.  Neck:     Thyroid: No thyroid mass, thyromegaly or thyroid tenderness.  Cardiovascular:     Rate and Rhythm: Regular rhythm.      Heart sounds: Normal heart sounds.  Pulmonary:     Effort: Pulmonary effort is normal.     Breath sounds: Normal breath sounds.  Chest:  Breasts:    Breasts are symmetrical.     Right: Normal. No inverted nipple, mass, nipple discharge, skin change or tenderness.     Left: Normal. No inverted nipple, mass, nipple discharge, skin change or tenderness.  Abdominal:     General: Abdomen is flat. Bowel sounds are normal.     Palpations: Abdomen is soft.  Genitourinary:    General: Normal vulva.     Vagina: Normal. No vaginal discharge, bleeding or lesions.     Cervix: Normal. No discharge or lesion.     Uterus: Normal. Not enlarged and not tender.      Adnexa: Right adnexa normal and left adnexa normal.       Right: No mass, tenderness or fullness.         Left: No mass, tenderness or fullness.    Lymphadenopathy:     Upper Body:     Right upper body: No axillary adenopathy.     Left upper body: No axillary adenopathy.  Skin:    General: Skin is warm and dry.  Neurological:     Mental Status: She is alert and oriented to person, place, and time.  Psychiatric:        Mood and Affect: Mood normal.  Thought Content: Thought content normal.        Judgment: Judgment normal.      Raynelle Fanning, CMA present for exam  Assessment/Plan:   1. Well woman exam with routine gynecological exam (Primary) - Cytology - PAP( [place])  2. Screening for STDs (sexually transmitted diseases) - Cytology - PAP( [place])  3. Encounter for initial prescription of other contraceptives  - Lactic Ac-Citric Ac-Pot Bitart (PHEXXI) 1.8-1-0.4 % GEL; Place 5 g vaginally as directed. Before intercourse  Dispense: 120 g; Refill: 11    Discussed SBE, pap and STI screening as directed/appropriate. Recommend of exercise weekly, including weight bearing exercise. Encouraged the use of seatbelts and sunscreen.  Return in about 1 year (around 06/03/2024) for Annual.  Arlie Solomons B  WHNP-BC 9:08 AM 06/04/2023

## 2023-06-05 ENCOUNTER — Telehealth: Payer: Self-pay

## 2023-06-05 NOTE — Telephone Encounter (Signed)
 Prior authorization request received from covermymeds for Phexxi.  PA initiated.  Patient notified. KEY: [id number] DX: [id number]

## 2023-06-05 NOTE — Telephone Encounter (Signed)
 PA for Phexxi approved. Patient notified.

## 2023-06-06 LAB — CYTOLOGY - PAP
Adequacy: ABSENT
Chlamydia: NEGATIVE
Comment: NEGATIVE
Comment: NEGATIVE
Comment: NORMAL
Diagnosis: NEGATIVE
Neisseria Gonorrhea: NEGATIVE
Trichomonas: NEGATIVE

## 2023-06-06 MED ORDER — METRONIDAZOLE 500 MG PO TABS: 500.0000 mg | ORAL_TABLET | Freq: Two times a day (BID) | ORAL | 0 refills | Status: AC

## 2023-07-08 ENCOUNTER — Encounter: Payer: Self-pay | Admitting: Family

## 2023-07-08 ENCOUNTER — Ambulatory Visit (INDEPENDENT_AMBULATORY_CARE_PROVIDER_SITE_OTHER): Payer: 59 | Admitting: Family

## 2023-07-08 VITALS — BP 124/89 | HR 94 | Temp 98.2°F | Resp 16 | Ht 61.0 in | Wt 236.4 lb

## 2023-07-08 DIAGNOSIS — I10 Essential (primary) hypertension: Secondary | ICD-10-CM | POA: Diagnosis not present

## 2023-07-08 DIAGNOSIS — E785 Hyperlipidemia, unspecified: Secondary | ICD-10-CM | POA: Diagnosis not present

## 2023-07-08 MED ORDER — VALSARTAN 40 MG PO TABS: 40.0000 mg | ORAL_TABLET | Freq: Every day | ORAL | 0 refills | Status: DC

## 2023-07-08 NOTE — Progress Notes (Signed)
 Patient ID: Molly Dominguez, female    DOB: December 26, 2000  MRN: [id number]  CC: Chronic Conditions Follow-Up  Subjective: Molly Dominguez is a 23 y.o. female who presents for chronic conditions follow-up.   Her concerns today include:  - Doing well on Valsartan, no issues/concerns. She does not complain of red flag symptoms such as but not limited to chest pain, shortness of breath, worst headache of life, nausea/vomiting.  - States since previous office visit she did not begin taking Atorvastatin. States she is watching what she eats and has lost weight. States her family encourages her to begin weight loss medication but she isn't ready to do so.  Patient Active Problem List   Diagnosis Date Noted   Healthcare maintenance 01/21/2017   Obesity (BMI 35.0-39.9 without comorbidity) 01/21/2017     Current Outpatient Medications on File Prior to Visit  Medication Sig Dispense Refill   Lactic Ac-Citric Ac-Pot Bitart (PHEXXI) 1.8-1-0.4 % GEL Place 5 g vaginally as directed. Before intercourse 120 g 11   atorvastatin (LIPITOR) 20 MG tablet Take 1 tablet (20 mg total) by mouth daily. (Patient not taking: Reported on 06/04/2023) 90 tablet 0   No current facility-administered medications on file prior to visit.    No Known Allergies  Social History   Socioeconomic History   Marital status: Single    Spouse name: Not on file   Number of children: Not on file   Years of education: Not on file   Highest education level: Bachelor's degree (e.g., BA, AB, BS)  Occupational History   Not on file  Tobacco Use   Smoking status: Never    Passive exposure: Never   Smokeless tobacco: Never  Vaping Use   Vaping status: Former  Substance and Sexual Activity   Alcohol use: Yes    Comment: socailly   Drug use: Not Currently    Types: Marijuana   Sexual activity: Yes    Partners: Male    Birth control/protection: None    Comment: INTERCOUSE AGE 16, LESS THAN 5 SEXUAL PARTERNS  Other Topics Concern    Not on file  Social History Narrative   Not on file   Social Drivers of Health   Financial Resource Strain: Low Risk  (04/05/2023)   Overall Financial Resource Strain (CARDIA)    Difficulty of Paying Living Expenses: Not very hard  Food Insecurity: Food Insecurity Present (04/05/2023)   Hunger Vital Sign    Worried About Running Out of Food in the Last Year: Sometimes true    Ran Out of Food in the Last Year: Never true  Transportation Needs: No Transportation Needs (04/05/2023)   PRAPARE - Administrator, Civil Service (Medical): No    Lack of Transportation (Non-Medical): No  Physical Activity: Insufficiently Active (04/05/2023)   Exercise Vital Sign    Days of Exercise per Week: 2 days    Minutes of Exercise per Session: 30 min  Stress: No Stress Concern Present (04/05/2023)   [employer] of Occupational Health - Occupational Stress Questionnaire    Feeling of Stress : Not at all  Social Connections: Moderately Isolated (04/05/2023)   Social Connection and Isolation Panel [NHANES]    Frequency of Communication with Friends and Family: More than three times a week    Frequency of Social Gatherings with Friends and Family: More than three times a week    Attends Religious Services: More than 4 times per year    Active Member of [employer] or Organizations:  No    Attends Banker Meetings: Never    Marital Status: Never married  Intimate Partner Violence: Not At Risk (07/08/2023)   Humiliation, Afraid, Rape, and Kick questionnaire    Fear of Current or Ex-Partner: No    Emotionally Abused: No    Physically Abused: No    Sexually Abused: No    Family History  Problem Relation Age of Onset   Hyperlipidemia Mother    Hypertension Mother    Hypertension Father    Hyperlipidemia Father     History reviewed. No pertinent surgical history.  ROS: Review of Systems Negative except as stated above  PHYSICAL EXAM: BP 124/89   Pulse 94   Temp 98.2 F (36.8  C) (Oral)   Resp 16   Ht 5\' 1"  (1.549 m)   Wt 236 lb 6.4 oz (107.2 kg)   SpO2 97%   BMI 44.67 kg/m   Wt Readings from Last 3 Encounters:  07/08/23 236 lb 6.4 oz (107.2 kg)  06/04/23 241 lb 6.4 oz (109.5 kg)  04/09/23 247 lb 3.2 oz (112.1 kg)   Physical Exam HENT:     Head: Normocephalic and atraumatic.     Nose: Nose normal.     Mouth/Throat:     Mouth: Mucous membranes are moist.     Pharynx: Oropharynx is clear.  Eyes:     Extraocular Movements: Extraocular movements intact.     Conjunctiva/sclera: Conjunctivae normal.     Pupils: Pupils are equal, round, and reactive to light.  Cardiovascular:     Rate and Rhythm: Normal rate and regular rhythm.     Pulses: Normal pulses.     Heart sounds: Normal heart sounds.  Pulmonary:     Effort: Pulmonary effort is normal.     Breath sounds: Normal breath sounds.  Musculoskeletal:        General: Normal range of motion.     Cervical back: Normal range of motion and neck supple.  Neurological:     General: No focal deficit present.     Mental Status: She is alert and oriented to person, place, and time.  Psychiatric:        Mood and Affect: Mood normal.        Behavior: Behavior normal.     ASSESSMENT AND PLAN: 1. Primary hypertension (Primary) - Continue Valsartan as prescribed.  - Routine screening.  - Counseled on blood pressure goal of less than 130/80, low-sodium, DASH diet, medication compliance, and 150 minutes of moderate intensity exercise per week as tolerated. Counseled on medication adherence and adverse effects. - Follow-up with primary provider in 3 months or sooner if needed. - valsartan (DIOVAN) 40 MG tablet; Take 1 tablet (40 mg total) by mouth daily.  Dispense: 90 tablet; Refill: 0 - Basic Metabolic Panel  2. Hyperlipidemia, unspecified hyperlipidemia type - Patient reports she did not begin Atorvastatin. - Routine screening.  - Follow-up with primary provider as scheduled. - Lipid panel   Patient  was given the opportunity to ask questions.  Patient verbalized understanding of the plan and was able to repeat key elements of the plan. Patient was given clear instructions to go to Emergency Department or return to medical center if symptoms don't improve, worsen, or new problems develop.The patient verbalized understanding.   Orders Placed This Encounter  Procedures   Lipid panel   Basic Metabolic Panel     Requested Prescriptions   Signed Prescriptions Disp Refills   valsartan (DIOVAN) 40 MG tablet  90 tablet 0    Sig: Take 1 tablet (40 mg total) by mouth daily.    Return in about 3 months (around 10/07/2023) for Follow-Up or next available chronic conditions.  Rema Fendt, NP

## 2023-07-08 NOTE — Progress Notes (Signed)
 3 month

## 2023-07-09 ENCOUNTER — Other Ambulatory Visit: Payer: Self-pay | Admitting: Family

## 2023-07-09 ENCOUNTER — Encounter: Payer: Self-pay | Admitting: Family

## 2023-07-09 DIAGNOSIS — E785 Hyperlipidemia, unspecified: Secondary | ICD-10-CM

## 2023-07-09 LAB — BASIC METABOLIC PANEL WITH GFR
BUN/Creatinine Ratio: 19 (ref 9–23)
BUN: 13 mg/dL (ref 6–20)
CO2: 19 mmol/L — ABNORMAL LOW (ref 20–29)
Calcium: 9.2 mg/dL (ref 8.7–10.2)
Chloride: 104 mmol/L (ref 96–106)
Creatinine, Ser: 0.69 mg/dL (ref 0.57–1.00)
Glucose: 92 mg/dL (ref 70–99)
Potassium: 4.5 mmol/L (ref 3.5–5.2)
Sodium: 138 mmol/L (ref 134–144)
eGFR: 125 mL/min/{1.73_m2} (ref >59)

## 2023-07-09 LAB — LIPID PANEL
Chol/HDL Ratio: 5.1 ratio — ABNORMAL HIGH (ref 0.0–4.4)
Cholesterol, Total: 198 mg/dL (ref 100–199)
HDL: 39 mg/dL — ABNORMAL LOW (ref >39)
LDL Chol Calc (NIH): 141 mg/dL — ABNORMAL HIGH (ref 0–99)
Triglycerides: 96 mg/dL (ref 0–149)
VLDL Cholesterol Cal: 18 mg/dL (ref 5–40)

## 2023-07-09 MED ORDER — ATORVASTATIN CALCIUM 20 MG PO TABS: 20.0000 mg | ORAL_TABLET | Freq: Every day | ORAL | 0 refills | Status: DC

## 2023-07-09 NOTE — Progress Notes (Signed)
 I called patient to go over lab results and no one answered so I left a voicemail to return my call.

## 2023-07-12 NOTE — Telephone Encounter (Signed)
 Paper printed oput and put in provider folder

## 2023-07-31 NOTE — Telephone Encounter (Signed)
 Provider has papers at her desk. Awaiting signature

## 2023-08-07 ENCOUNTER — Other Ambulatory Visit: Payer: Self-pay | Admitting: Family

## 2023-08-07 DIAGNOSIS — E785 Hyperlipidemia, unspecified: Secondary | ICD-10-CM

## 2023-08-07 DIAGNOSIS — I10 Essential (primary) hypertension: Secondary | ICD-10-CM

## 2023-08-07 MED ORDER — ATORVASTATIN CALCIUM 20 MG PO TABS: 20.0000 mg | ORAL_TABLET | Freq: Every day | ORAL | 0 refills | Status: DC

## 2023-08-07 MED ORDER — VALSARTAN 40 MG PO TABS: 40.0000 mg | ORAL_TABLET | Freq: Every day | ORAL | 0 refills | Status: DC

## 2023-08-07 NOTE — Telephone Encounter (Signed)
 I have not received any paper to sign. Valsartan  and Atorvastatin  refilled and sent to pharmacy on file. Please let me know if I can further assist. Thank you.

## 2023-08-08 ENCOUNTER — Other Ambulatory Visit: Payer: Self-pay | Admitting: Emergency Medicine

## 2023-08-08 DIAGNOSIS — I10 Essential (primary) hypertension: Secondary | ICD-10-CM

## 2023-08-08 NOTE — Telephone Encounter (Signed)
 Valsartan  prescribed 08/07/2023 for 90 day supply.

## 2023-08-09 ENCOUNTER — Other Ambulatory Visit: Payer: Self-pay | Admitting: Emergency Medicine

## 2023-08-09 ENCOUNTER — Telehealth: Payer: Self-pay | Admitting: Family

## 2023-08-09 ENCOUNTER — Other Ambulatory Visit: Payer: Self-pay | Admitting: *Deleted

## 2023-08-09 DIAGNOSIS — E785 Hyperlipidemia, unspecified: Secondary | ICD-10-CM

## 2023-08-09 DIAGNOSIS — I10 Essential (primary) hypertension: Secondary | ICD-10-CM

## 2023-08-09 MED ORDER — VALSARTAN 40 MG PO TABS: 40.0000 mg | ORAL_TABLET | Freq: Every day | ORAL | 0 refills | Status: DC

## 2023-08-09 MED ORDER — ATORVASTATIN CALCIUM 20 MG PO TABS
20.0000 mg | ORAL_TABLET | Freq: Every day | ORAL | 0 refills | Status: DC
Start: 2023-08-09 — End: 2023-11-20

## 2023-08-09 NOTE — Telephone Encounter (Signed)
 Left message on [facility] pharmacy voicemail to discontinue valsartan  40mg  and Atorvastatin  20 mg because we resent presciption to patient preferred mail order pharmacy [facility].

## 2023-10-07 ENCOUNTER — Ambulatory Visit: Admitting: Family

## 2023-11-01 ENCOUNTER — Ambulatory Visit: Admitting: Family

## 2023-11-20 ENCOUNTER — Other Ambulatory Visit (HOSPITAL_COMMUNITY)
Admission: RE | Admit: 2023-11-20 | Discharge: 2023-11-20 | Disposition: A | Discharge status: Home / Self Care | Source: Ambulatory Visit | Attending: Family | Admitting: Family

## 2023-11-20 ENCOUNTER — Encounter: Payer: Self-pay | Admitting: Family

## 2023-11-20 ENCOUNTER — Ambulatory Visit (INDEPENDENT_AMBULATORY_CARE_PROVIDER_SITE_OTHER): Admitting: Family

## 2023-11-20 VITALS — BP 136/88 | HR 91 | Temp 98.4°F | Resp 16 | Ht 61.0 in | Wt 233.0 lb

## 2023-11-20 DIAGNOSIS — E785 Hyperlipidemia, unspecified: Secondary | ICD-10-CM

## 2023-11-20 DIAGNOSIS — R399 Unspecified symptoms and signs involving the genitourinary system: Secondary | ICD-10-CM

## 2023-11-20 DIAGNOSIS — I10 Essential (primary) hypertension: Secondary | ICD-10-CM | POA: Diagnosis not present

## 2023-11-20 LAB — POCT URINALYSIS DIP (CLINITEK)
Bilirubin, UA: NEGATIVE
Glucose, UA: NEGATIVE mg/dL
Ketones, POC UA: NEGATIVE mg/dL
Nitrite, UA: NEGATIVE
POC PROTEIN,UA: 30 — AB
Spec Grav, UA: >=1.03 — AB (ref 1.010–1.025)
Urobilinogen, UA: 0.2 U/dL
pH, UA: 7 (ref 5.0–8.0)

## 2023-11-20 MED ORDER — ATORVASTATIN CALCIUM 20 MG PO TABS: 20.0000 mg | ORAL_TABLET | Freq: Every day | ORAL | 0 refills | Status: AC

## 2023-11-20 MED ORDER — VALSARTAN 40 MG PO TABS: 40.0000 mg | ORAL_TABLET | Freq: Every day | ORAL | 0 refills | Status: AC

## 2023-11-20 NOTE — Progress Notes (Unsigned)
 3 month follow, started on Sunday pain when urinating, .abdominal pain and back pain, if sending in medication today patient wants it to go to [facility]

## 2023-11-20 NOTE — Progress Notes (Unsigned)
 Patient ID: Molly Dominguez, female    DOB: May 09, 2000  MRN: [id number]  CC: Chronic Conditions Follow-Up  Subjective: Tarshia Kot is a 23 y.o. female who presents for chronic conditions follow-up.  Her concerns today include: ***  Patient Active Problem List   Diagnosis Date Noted   Healthcare maintenance 01/21/2017   Obesity (BMI 35.0-39.9 without comorbidity) 01/21/2017     Current Outpatient Medications on File Prior to Visit  Medication Sig Dispense Refill   atorvastatin  (LIPITOR) 20 MG tablet Take 1 tablet (20 mg total) by mouth daily. 90 tablet 0   Lactic Ac-Citric Ac-Pot Bitart (PHEXXI ) 1.8-1-0.4 % GEL Place 5 g vaginally as directed. Before intercourse 120 g 11   valsartan  (DIOVAN ) 40 MG tablet Take 1 tablet (40 mg total) by mouth daily. 90 tablet 0   No current facility-administered medications on file prior to visit.    No Known Allergies  Social History   Socioeconomic History   Marital status: Single    Spouse name: Not on file   Number of children: Not on file   Years of education: Not on file   Highest education level: Bachelor's degree (e.g., BA, AB, BS)  Occupational History   Not on file  Tobacco Use   Smoking status: Never    Passive exposure: Never   Smokeless tobacco: Never  Vaping Use   Vaping status: Former  Substance and Sexual Activity   Alcohol use: Yes    Comment: socailly   Drug use: Not Currently    Types: Marijuana   Sexual activity: Yes    Partners: Male    Birth control/protection: None    Comment: INTERCOUSE AGE 12, LESS THAN 5 SEXUAL PARTERNS  Other Topics Concern   Not on file  Social History Narrative   Not on file   Social Drivers of Health   Financial Resource Strain: Medium Risk (11/16/2023)   Overall Financial Resource Strain (CARDIA)    Difficulty of Paying Living Expenses: Somewhat hard  Food Insecurity: No Food Insecurity (11/16/2023)   Hunger Vital Sign    Worried About Running Out of Food in the Last Year:  Never true    Ran Out of Food in the Last Year: Never true  Transportation Needs: No Transportation Needs (11/16/2023)   PRAPARE - Administrator, Civil Service (Medical): No    Lack of Transportation (Non-Medical): No  Physical Activity: Insufficiently Active (11/16/2023)   Exercise Vital Sign    Days of Exercise per Week: 2 days    Minutes of Exercise per Session: 30 min  Stress: No Stress Concern Present (11/16/2023)   [employer] of Occupational Health - Occupational Stress Questionnaire    Feeling of Stress: Not at all  Social Connections: Moderately Isolated (11/16/2023)   Social Connection and Isolation Panel    Frequency of Communication with Friends and Family: More than three times a week    Frequency of Social Gatherings with Friends and Family: More than three times a week    Attends Religious Services: 1 to 4 times per year    Active Member of [employer] or Organizations: No    Attends Banker Meetings: Not on file    Marital Status: Never married  Intimate Partner Violence: Not At Risk (07/08/2023)   Humiliation, Afraid, Rape, and Kick questionnaire    Fear of Current or Ex-Partner: No    Emotionally Abused: No    Physically Abused: No    Sexually Abused: No  Family History  Problem Relation Age of Onset   Hyperlipidemia Mother    Hypertension Mother    Hypertension Father    Hyperlipidemia Father     No past surgical history on file.  ROS: Review of Systems Negative except as stated above  PHYSICAL EXAM: There were no vitals taken for this visit.  Physical Exam  {female adult master:310786} {female adult master:310785}     Latest Ref Rng & Units 07/08/2023    8:43 AM 04/09/2023   10:50 AM 02/11/2023    4:26 PM  CMP  Glucose 70 - 99 mg/dL 92   91   BUN 6 - 20 mg/dL 13   11   Creatinine 9.42 - 1.00 mg/dL 9.30   9.30   Sodium 865 - 144 mmol/L 138   137   Potassium 3.5 - 5.2 mmol/L 4.5   4.3   Chloride 96 - 106 mmol/L 104   99    CO2 20 - 29 mmol/L 19   21   Calcium  8.7 - 10.2 mg/dL 9.2   9.5   Total Protein 6.0 - 8.5 g/dL  7.4    Total Bilirubin 0.0 - 1.2 mg/dL  0.4    Alkaline Phos 44 - 121 IU/L  111    AST 0 - 40 IU/L  12    ALT 0 - 32 IU/L  13     Lipid Panel     Component Value Date/Time   CHOL 198 07/08/2023 0843   TRIG 96 07/08/2023 0843   HDL 39 (L) 07/08/2023 0843   CHOLHDL 5.1 (H) 07/08/2023 0843   LDLCALC 141 (H) 07/08/2023 0843    CBC    Component Value Date/Time   WBC 9.4 04/09/2023 1050   WBC 13.5 (H) 01/25/2020 1538   RBC 5.28 04/09/2023 1050   RBC 4.97 01/25/2020 1538   HGB 14.9 04/09/2023 1050   HCT 45.5 04/09/2023 1050   PLT 352 04/09/2023 1050   MCV 86 04/09/2023 1050   MCH 28.2 04/09/2023 1050   MCH 28.6 01/25/2020 1538   MCHC 32.7 04/09/2023 1050   MCHC 32.9 01/25/2020 1538   RDW 13.1 04/09/2023 1050   LYMPHSABS 2.7 10/04/2020 1705   EOSABS 0.2 10/04/2020 1705   BASOSABS 0.1 10/04/2020 1705    ASSESSMENT AND PLAN:  There are no diagnoses linked to this encounter.   Patient was given the opportunity to ask questions.  Patient verbalized understanding of the plan and was able to repeat key elements of the plan. Patient was given clear instructions to go to Emergency Department or return to medical center if symptoms don't improve, worsen, or new problems develop.The patient verbalized understanding.   No orders of the defined types were placed in this encounter.    Requested Prescriptions    No prescriptions requested or ordered in this encounter    Return in about 3 months (around 02/20/2024) for Follow-Up or next available chronic conditions.  Greig JINNY Drones, NP

## 2023-11-21 ENCOUNTER — Ambulatory Visit: Payer: Self-pay | Admitting: Family

## 2023-11-21 DIAGNOSIS — N3001 Acute cystitis with hematuria: Secondary | ICD-10-CM

## 2023-11-21 DIAGNOSIS — N760 Acute vaginitis: Secondary | ICD-10-CM

## 2023-11-21 LAB — CERVICOVAGINAL ANCILLARY ONLY
Bacterial Vaginitis (gardnerella): POSITIVE — AB
Candida Glabrata: NEGATIVE
Candida Vaginitis: NEGATIVE
Chlamydia: NEGATIVE
Comment: NEGATIVE
Comment: NEGATIVE
Comment: NEGATIVE
Comment: NEGATIVE
Comment: NEGATIVE
Comment: NORMAL
Neisseria Gonorrhea: NEGATIVE
Trichomonas: NEGATIVE

## 2023-11-21 LAB — LIPID PANEL
Chol/HDL Ratio: 3.8 ratio (ref 0.0–4.4)
Cholesterol, Total: 142 mg/dL (ref 100–199)
HDL: 37 mg/dL — ABNORMAL LOW
LDL Chol Calc (NIH): 83 mg/dL (ref 0–99)
Triglycerides: 119 mg/dL (ref 0–149)
VLDL Cholesterol Cal: 22 mg/dL (ref 5–40)

## 2023-11-21 MED ORDER — METRONIDAZOLE 500 MG PO TABS: 500.0000 mg | ORAL_TABLET | Freq: Two times a day (BID) | ORAL | 0 refills | Status: AC

## 2023-11-21 NOTE — Telephone Encounter (Signed)
 Cervicovaginal ancillary (which screens for STI's, yeast, and bacterial vaginitis) results pending.

## 2023-11-21 NOTE — Telephone Encounter (Signed)
 Copied from CRM #[id number]. Topic: Clinical - Lab/Test Results >> Nov 21, 2023  8:20 AM Marissa P wrote: Reason for CRM: Patient would like a call as soon as possible to go over results please.

## 2023-11-24 LAB — URINE CULTURE

## 2023-11-26 MED ORDER — SULFAMETHOXAZOLE-TRIMETHOPRIM 800-160 MG PO TABS: 1.0000 | ORAL_TABLET | Freq: Two times a day (BID) | ORAL | 0 refills | Status: AC

## 2024-02-21 ENCOUNTER — Ambulatory Visit: Admitting: Family

## 2024-04-03 ENCOUNTER — Encounter: Admitting: Family

## 2024-04-03 NOTE — Progress Notes (Signed)
 Erroneous encounter-disregard

## 2024-06-04 ENCOUNTER — Ambulatory Visit: Admitting: Radiology
